# Patient Record
Sex: Female | Born: 1935 | Race: White | Hispanic: No | Marital: Married | Smoking: Never smoker
Health system: Southern US, Community
[De-identification: ages and names within clinical notes are randomized; demographics above are authoritative.]

## PROBLEM LIST (undated history)

## (undated) DIAGNOSIS — M1990 Unspecified osteoarthritis, unspecified site: Secondary | ICD-10-CM

## (undated) DIAGNOSIS — R2232 Localized swelling, mass and lump, left upper limb: Secondary | ICD-10-CM

## (undated) DIAGNOSIS — I4891 Unspecified atrial fibrillation: Secondary | ICD-10-CM

## (undated) DIAGNOSIS — I454 Nonspecific intraventricular block: Secondary | ICD-10-CM

## (undated) DIAGNOSIS — J449 Chronic obstructive pulmonary disease, unspecified: Secondary | ICD-10-CM

## (undated) HISTORY — PX: BREAST SURGERY: SHX581

## (undated) HISTORY — PX: OTHER SURGICAL HISTORY: SHX169

---

## 2009-01-04 ENCOUNTER — Ambulatory Visit: Payer: Self-pay | Admitting: Diagnostic Radiology

## 2009-01-04 ENCOUNTER — Emergency Department (HOSPITAL_BASED_OUTPATIENT_CLINIC_OR_DEPARTMENT_OTHER): Admission: EM | Admit: 2009-01-04 | Discharge: 2009-01-04 | Payer: Self-pay | Admitting: Emergency Medicine

## 2009-12-20 ENCOUNTER — Ambulatory Visit: Payer: Self-pay | Admitting: Diagnostic Radiology

## 2009-12-20 ENCOUNTER — Emergency Department (HOSPITAL_BASED_OUTPATIENT_CLINIC_OR_DEPARTMENT_OTHER): Admission: EM | Admit: 2009-12-20 | Discharge: 2009-12-20 | Payer: Self-pay | Admitting: Emergency Medicine

## 2011-02-28 LAB — CBC
HCT: 40.3 % (ref 36.0–46.0)
Hemoglobin: 13.9 g/dL (ref 12.0–15.0)
WBC: 15.7 10*3/uL — ABNORMAL HIGH (ref 4.0–10.5)

## 2011-02-28 LAB — BASIC METABOLIC PANEL
BUN: 14 mg/dL (ref 6–23)
Calcium: 9.3 mg/dL (ref 8.4–10.5)
Chloride: 103 mEq/L (ref 96–112)
GFR calc non Af Amer: >60 mL/min (ref >60)

## 2011-02-28 LAB — URINALYSIS, ROUTINE W REFLEX MICROSCOPIC
Bilirubin Urine: NEGATIVE
Glucose, UA: NEGATIVE mg/dL
Ketones, ur: NEGATIVE mg/dL
Protein, ur: NEGATIVE mg/dL
pH: 6.5 (ref 5.0–8.0)

## 2011-02-28 LAB — DIFFERENTIAL
Lymphocytes Relative: 19 % (ref 12–46)
Monocytes Absolute: 1.2 10*3/uL — ABNORMAL HIGH (ref 0.1–1.0)
Monocytes Relative: 8 % (ref 3–12)
Neutro Abs: 10.9 10*3/uL — ABNORMAL HIGH (ref 1.7–7.7)

## 2012-03-12 ENCOUNTER — Encounter (HOSPITAL_BASED_OUTPATIENT_CLINIC_OR_DEPARTMENT_OTHER): Payer: Self-pay | Admitting: *Deleted

## 2012-03-12 ENCOUNTER — Emergency Department (HOSPITAL_BASED_OUTPATIENT_CLINIC_OR_DEPARTMENT_OTHER)
Admission: EM | Admit: 2012-03-12 | Discharge: 2012-03-12 | Disposition: A | Discharge status: Home / Self Care | Payer: PRIVATE HEALTH INSURANCE | Attending: Emergency Medicine | Admitting: Emergency Medicine

## 2012-03-12 DIAGNOSIS — J4489 Other specified chronic obstructive pulmonary disease: Secondary | ICD-10-CM | POA: Insufficient documentation

## 2012-03-12 DIAGNOSIS — J449 Chronic obstructive pulmonary disease, unspecified: Secondary | ICD-10-CM

## 2012-03-12 DIAGNOSIS — R059 Cough, unspecified: Secondary | ICD-10-CM | POA: Insufficient documentation

## 2012-03-12 DIAGNOSIS — J3489 Other specified disorders of nose and nasal sinuses: Secondary | ICD-10-CM | POA: Insufficient documentation

## 2012-03-12 DIAGNOSIS — R05 Cough: Secondary | ICD-10-CM | POA: Insufficient documentation

## 2012-03-12 MED ORDER — IPRATROPIUM BROMIDE 0.02 % IN SOLN
0.5000 mg | Freq: Once | RESPIRATORY_TRACT | Status: AC
  Administered 2012-03-12: 0.5 mg via RESPIRATORY_TRACT
  Filled 2012-03-12: qty 2.5

## 2012-03-12 MED ORDER — ALBUTEROL SULFATE (5 MG/ML) 0.5% IN NEBU
5.0000 mg | INHALATION_SOLUTION | Freq: Once | RESPIRATORY_TRACT | Status: AC
  Administered 2012-03-12: 5 mg via RESPIRATORY_TRACT
  Filled 2012-03-12: qty 1

## 2012-03-12 MED ORDER — PREDNISONE 20 MG PO TABS: 60.0000 mg | ORAL_TABLET | Freq: Every day | ORAL | Status: AC

## 2012-03-12 MED ORDER — PREDNISONE 50 MG PO TABS
60.0000 mg | ORAL_TABLET | Freq: Once | ORAL | Status: AC
  Administered 2012-03-12: 60 mg via ORAL
  Filled 2012-03-12: qty 1

## 2012-03-12 NOTE — Discharge Instructions (Signed)

## 2012-03-12 NOTE — ED Provider Notes (Signed)
History   This chart was scribed for Celene Kras, MD by Melba Coon. The patient was seen in room MH04/MH04 and the patient's care was started at 5:40PM.    CSN: [id number]  Arrival date & time 03/12/12  1640   First MD Initiated Contact with Patient 03/12/12 1736      Chief Complaint  Patient presents with  . Cough    (Consider location/radiation/quality/duration/timing/severity/associated sxs/prior treatment) HPI Ashley Mccall is a 78 y.o. female who presents to the Emergency Department complaining of persistent moderate to severe cough with associated nasal drainage with an onset 3 days ago. Pt has a hx of bronchitis and states that this episode feels the same. Pt states that nasal drainage is bloody, yellow and sticky and that she wheezes more than usual. She has not used any inhalers recently. No HA, fever, neck pain, sore throat, rash, back pain, CP, SOB, abd pain, n/v/d, dysuria, or extremity pain, edema, weakness, numbness, or tingling. Hx of COPD and adult-onset asthma. No known allergies. No other pertinent medical symptoms. Pt is a former smoker (quit in 1985).  History reviewed. No pertinent past medical history.  Past Surgical History  Procedure Date  . Breast surgery     History reviewed. No pertinent family history.  History  Substance Use Topics  . Smoking status: Never Smoker   . Smokeless tobacco: Not on file  . Alcohol Use: No    OB History    Grav Para Term Preterm Abortions TAB SAB Ect Mult Living                  Review of Systems 10 Systems reviewed and all are negative for acute change except as noted in the HPI.   Allergies  Demerol; Macrobid; and Tetracyclines & related  Home Medications   Current Outpatient Rx  Name Route Sig Dispense Refill  . ACETAMINOPHEN 500 MG PO TABS Oral Take 500 mg by mouth 3 (three) times daily as needed. For pain    . ALBUTEROL SULFATE HFA 108 (90 BASE) MCG/ACT IN AERS Inhalation Inhale 2 puffs into the  lungs 2 (two) times daily. For COPD    . AMLODIPINE BESYLATE 10 MG PO TABS Oral Take 10 mg by mouth daily.    . ASPIRIN 81 MG PO TABS Oral Take 81 mg by mouth daily.    Marland Kitchen CALCIUM + D PO Oral Take 1 tablet by mouth daily.    . DONEPEZIL HCL 10 MG PO TABS Oral Take 10 mg by mouth at bedtime.    Marland Kitchen FEXOFENADINE HCL 180 MG PO TABS Oral Take 180 mg by mouth daily.    Marland Kitchen FLUTICASONE PROPIONATE  HFA 220 MCG/ACT IN AERO Inhalation Inhale 2 puffs into the lungs 2 (two) times daily. For COPD    . MONTELUKAST SODIUM 10 MG PO TABS Oral Take 10 mg by mouth at bedtime.    . OMEPRAZOLE MAGNESIUM 20 MG PO TBEC Oral Take 20 mg by mouth daily.    Marland Kitchen POTASSIUM PO Oral Take 1 tablet by mouth daily.      BP 133/62  Pulse 72  Temp(Src) 97.9 F (36.6 C) (Oral)  Resp 20  Ht 5\' 4"  (1.626 m)  Wt 163 lb (73.936 kg)  BMI 27.98 kg/m2  SpO2 96%  Physical Exam  Nursing note and vitals reviewed. Constitutional: She appears well-developed and well-nourished. No distress.  HENT:  Head: Normocephalic and atraumatic.  Right Ear: External ear normal.  Left Ear: External ear normal.  Eyes: Conjunctivae are normal. Right eye exhibits no discharge. Left eye exhibits no discharge. No scleral icterus.  Neck: Neck supple. No tracheal deviation present.  Cardiovascular: Normal rate, regular rhythm and intact distal pulses.   Pulmonary/Chest: Effort normal and breath sounds normal. No stridor. No respiratory distress. She has no wheezes. She has no rales.       Frequent coughing; slightly diminished sounds at the bases  Abdominal: Soft. Bowel sounds are normal. She exhibits no distension. There is no tenderness. There is no rebound and no guarding.  Musculoskeletal: She exhibits no edema and no tenderness.  Neurological: She is alert. She has normal strength. No sensory deficit. Cranial nerve deficit:  no gross defecits noted. She exhibits normal muscle tone. She displays no seizure activity. Coordination normal.  Skin: Skin  is warm and dry. No rash noted.  Psychiatric: She has a normal mood and affect.    ED Course  Procedures (including critical care time)  DIAGNOSTIC STUDIES: Oxygen Saturation is 96% on room air, adequate by my interpretation.    COORDINATION OF CARE:  5:45PM - Pt states that she does not a CXR or abx; only wants prednisone treatment and Rx; EDMD agrees; plans for d/c   Labs Reviewed - No data to display No results found.   1. COPD (chronic obstructive pulmonary disease)       MDM  I suspect the patient is having a COPD exacerbation/bronchitis. There is no wheezing on exam. There is no crackles to auscultation. Patient is comfortable with a course of steroids which has helped her in the past. She prefers to take over-the-counter cough medications as needed.  I personally performed the services described in this documentation, which was scribed in my presence.  The recorded information has been reviewed and considered.        Celene Kras, MD 03/12/12 [phone]

## 2012-03-12 NOTE — ED Notes (Signed)
Pt c/o cough x 3 days. Hx bronchitis. "Feels the same" Bloody, yellow drainage from nose.

## 2015-07-13 ENCOUNTER — Emergency Department (HOSPITAL_COMMUNITY)
Admission: EM | Admit: 2015-07-13 | Discharge: 2015-07-13 | Disposition: A | Discharge status: Home / Self Care | Payer: Medicare Other | Attending: Emergency Medicine | Admitting: Emergency Medicine

## 2015-07-13 ENCOUNTER — Encounter (HOSPITAL_COMMUNITY): Payer: Self-pay | Admitting: Emergency Medicine

## 2015-07-13 ENCOUNTER — Emergency Department (HOSPITAL_COMMUNITY): Payer: Medicare Other

## 2015-07-13 DIAGNOSIS — Z7982 Long term (current) use of aspirin: Secondary | ICD-10-CM | POA: Diagnosis not present

## 2015-07-13 DIAGNOSIS — J449 Chronic obstructive pulmonary disease, unspecified: Secondary | ICD-10-CM | POA: Insufficient documentation

## 2015-07-13 DIAGNOSIS — R1010 Upper abdominal pain, unspecified: Secondary | ICD-10-CM

## 2015-07-13 DIAGNOSIS — M1990 Unspecified osteoarthritis, unspecified site: Secondary | ICD-10-CM | POA: Insufficient documentation

## 2015-07-13 DIAGNOSIS — R079 Chest pain, unspecified: Secondary | ICD-10-CM | POA: Insufficient documentation

## 2015-07-13 DIAGNOSIS — I4891 Unspecified atrial fibrillation: Secondary | ICD-10-CM | POA: Insufficient documentation

## 2015-07-13 DIAGNOSIS — Z79899 Other long term (current) drug therapy: Secondary | ICD-10-CM | POA: Insufficient documentation

## 2015-07-13 DIAGNOSIS — R1013 Epigastric pain: Secondary | ICD-10-CM | POA: Diagnosis not present

## 2015-07-13 DIAGNOSIS — R109 Unspecified abdominal pain: Secondary | ICD-10-CM | POA: Diagnosis present

## 2015-07-13 DIAGNOSIS — R112 Nausea with vomiting, unspecified: Secondary | ICD-10-CM | POA: Diagnosis not present

## 2015-07-13 DIAGNOSIS — R61 Generalized hyperhidrosis: Secondary | ICD-10-CM | POA: Diagnosis not present

## 2015-07-13 HISTORY — DX: Localized swelling, mass and lump, left upper limb: R22.32

## 2015-07-13 HISTORY — DX: Unspecified osteoarthritis, unspecified site: M19.90

## 2015-07-13 HISTORY — DX: Chronic obstructive pulmonary disease, unspecified: J44.9

## 2015-07-13 HISTORY — DX: Unspecified atrial fibrillation: I48.91

## 2015-07-13 HISTORY — DX: Nonspecific intraventricular block: I45.4

## 2015-07-13 LAB — CBC WITH DIFFERENTIAL/PLATELET
BASOS ABS: 0 10*3/uL (ref 0.0–0.1)
BASOS PCT: 0 % (ref 0–1)
EOS PCT: 1 % (ref 0–5)
Eosinophils Absolute: 0.1 10*3/uL (ref 0.0–0.7)
HEMATOCRIT: 37.4 % (ref 36.0–46.0)
Hemoglobin: 12.6 g/dL (ref 12.0–15.0)
Lymphocytes Relative: 24 % (ref 12–46)
Lymphs Abs: 2.4 10*3/uL (ref 0.7–4.0)
MCH: 29.6 pg (ref 26.0–34.0)
MCHC: 33.7 g/dL (ref 30.0–36.0)
MCV: 88 fL (ref 78.0–100.0)
MONO ABS: 0.7 10*3/uL (ref 0.1–1.0)
Monocytes Relative: 7 % (ref 3–12)
Neutro Abs: 6.8 10*3/uL (ref 1.7–7.7)
Neutrophils Relative %: 68 % (ref 43–77)
Platelets: 217 10*3/uL (ref 150–400)
RBC: 4.25 MIL/uL (ref 3.87–5.11)
RDW: 13.8 % (ref 11.5–15.5)
WBC: 10 10*3/uL (ref 4.0–10.5)

## 2015-07-13 LAB — COMPREHENSIVE METABOLIC PANEL
ALT: 35 U/L (ref 14–54)
AST: 55 U/L — ABNORMAL HIGH (ref 15–41)
Albumin: 3.6 g/dL (ref 3.5–5.0)
Alkaline Phosphatase: 64 U/L (ref 38–126)
Anion gap: 6 (ref 5–15)
BUN: 18 mg/dL (ref 6–20)
CALCIUM: 9.2 mg/dL (ref 8.9–10.3)
CO2: 28 mmol/L (ref 22–32)
CREATININE: 0.75 mg/dL (ref 0.44–1.00)
Chloride: 108 mmol/L (ref 101–111)
GLUCOSE: 92 mg/dL (ref 65–99)
Potassium: 3.8 mmol/L (ref 3.5–5.1)
Sodium: 142 mmol/L (ref 135–145)
Total Bilirubin: 0.3 mg/dL (ref 0.3–1.2)
Total Protein: 6 g/dL — ABNORMAL LOW (ref 6.5–8.1)

## 2015-07-13 LAB — URINALYSIS, ROUTINE W REFLEX MICROSCOPIC
Bilirubin Urine: NEGATIVE
Glucose, UA: NEGATIVE mg/dL
Hgb urine dipstick: NEGATIVE
Ketones, ur: NEGATIVE mg/dL
LEUKOCYTES UA: NEGATIVE
NITRITE: NEGATIVE
PROTEIN: NEGATIVE mg/dL
SPECIFIC GRAVITY, URINE: 1.004 — AB (ref 1.005–1.030)
UROBILINOGEN UA: 0.2 mg/dL (ref 0.0–1.0)
pH: 7.5 (ref 5.0–8.0)

## 2015-07-13 LAB — I-STAT TROPONIN, ED: Troponin i, poc: 0.01 ng/mL (ref 0.00–0.08)

## 2015-07-13 LAB — LIPASE, BLOOD: Lipase: 36 U/L (ref 22–51)

## 2015-07-13 MED ORDER — ONDANSETRON HCL 4 MG/2ML IJ SOLN
4.0000 mg | Freq: Once | INTRAMUSCULAR | Status: AC
  Administered 2015-07-13: 4 mg via INTRAVENOUS
  Filled 2015-07-13: qty 2

## 2015-07-13 MED ORDER — SODIUM CHLORIDE 0.9 % IV BOLUS (SEPSIS)
500.0000 mL | Freq: Once | INTRAVENOUS | Status: AC
  Administered 2015-07-13: 500 mL via INTRAVENOUS

## 2015-07-13 NOTE — ED Notes (Signed)
Bed: YQ03 Expected date: 07/13/15 Expected time: 7:26 PM Means of arrival: Ambulance Comments: Nausea, vomiting

## 2015-07-13 NOTE — ED Notes (Signed)
Patient unable to urinate at this time. 

## 2015-07-13 NOTE — Discharge Instructions (Signed)
1. Medications: usual home medications 2. Treatment: rest, drink plenty of fluids,  3. Follow Up: Please followup with your primary doctor and cardiologist in 3-5 days for discussion of your diagnoses and further evaluation after today's visit; if you do not have a primary care doctor use the resource guide provided to find one; Please return to the ER for return of symptoms or other concerns

## 2015-07-13 NOTE — ED Notes (Signed)
Brought in by EMS from home with c/o abdominal pain.  Pt reports that she has had "big lunch" today from a buffet and felt fine until it's time for dinner when she started having LUQ abdominal pain with nausea and vomiting, no diarrhea.

## 2015-07-13 NOTE — ED Provider Notes (Signed)
CSN: [id number]     Arrival date & time 07/13/15  1948 History   First MD Initiated Contact with Patient 07/13/15 2009     Chief Complaint  Patient presents with  . Abdominal Pain     (Consider location/radiation/quality/duration/timing/severity/associated sxs/prior Treatment) Patient is a 81 y.o. female presenting with abdominal pain. The history is provided by the patient and medical records. No language interpreter was used.  Abdominal Pain Associated symptoms: chest pain, nausea and vomiting   Associated symptoms: no constipation, no cough, no diarrhea, no dysuria, no fatigue, no fever, no hematuria and no shortness of breath      Ashley Mccall is a 80 y.o. female  with a hx of arthritis, a-fib, COPD, BBB (reveal linq cardiac monitor implant) presents to the Emergency Department complaining of acute episode of near syncope with associated diaphoresis, nausea, vomiting x1 onset PTA. Episode was associated with sharp, left lower chest pain below her left breast that has resolved at this time.  No known aggravating of alleviating factors.  Pt reports she ate a larger than normal lunch.  She denies feeling ill prior to the episode and denies additional prodrome.  The episode was brief and resolved spontaneously. She denies hx of MI or pain like this in the past.  Pt denies fever, chills, headache, neck pain, SOB, cough, diarrhea, full syncope, dysuria.       Past Medical History  Diagnosis Date  . Arthritis   . Atrial fibrillation   . BBB (bundle branch block)   . COPD (chronic obstructive pulmonary disease)   . Nodule of left palm    Past Surgical History  Procedure Laterality Date  . Breast surgery    . Reveal linq cardiac monitor implant     History reviewed. No pertinent family history. History  Substance Use Topics  . Smoking status: Never Smoker   . Smokeless tobacco: Not on file  . Alcohol Use: No   OB History    No data available     Review of Systems   Constitutional: Positive for diaphoresis. Negative for fever, appetite change, fatigue and unexpected weight change.  HENT: Negative for mouth sores.   Eyes: Negative for visual disturbance.  Respiratory: Negative for cough, chest tightness, shortness of breath and wheezing.   Cardiovascular: Positive for chest pain.  Gastrointestinal: Positive for nausea, vomiting and abdominal pain. Negative for diarrhea and constipation.  Endocrine: Negative for polydipsia, polyphagia and polyuria.  Genitourinary: Negative for dysuria, urgency, frequency and hematuria.  Musculoskeletal: Negative for back pain and neck stiffness.  Skin: Negative for rash.  Allergic/Immunologic: Negative for immunocompromised state.  Neurological: Positive for syncope (near syncope). Negative for light-headedness and headaches.  Hematological: Does not bruise/bleed easily.  Psychiatric/Behavioral: Negative for sleep disturbance. The patient is not nervous/anxious.       Allergies  Demerol; Macrobid; and Tetracyclines & related  Home Medications   Prior to Admission medications   Medication Sig Start Date End Date Taking? Authorizing Provider  acetaminophen (TYLENOL) 500 MG tablet Take 500 mg by mouth 3 (three) times daily as needed for mild pain or moderate pain.    Yes Historical Provider, MD  albuterol (PROVENTIL HFA;VENTOLIN HFA) 108 (90 BASE) MCG/ACT inhaler Inhale 2 puffs into the lungs every 6 (six) hours as needed for wheezing or shortness of breath. For COPD   Yes Historical Provider, MD  amLODipine (NORVASC) 5 MG tablet Take 5 mg by mouth daily. 06/17/15  Yes Historical Provider, MD  aspirin 81 MG  tablet Take 162 mg by mouth 2 (two) times daily.    Yes Historical Provider, MD  Calcium Carbonate-Vitamin D (CALCIUM + D PO) Take 1 tablet by mouth daily.   Yes Historical Provider, MD  donepezil (ARICEPT) 10 MG tablet Take 10 mg by mouth at bedtime.   Yes Historical Provider, MD  fexofenadine (ALLEGRA) 180 MG  tablet Take 180 mg by mouth daily.   Yes Historical Provider, MD  fluticasone (FLOVENT HFA) 220 MCG/ACT inhaler Inhale 2 puffs into the lungs daily as needed (SOB, wheezing). For COPD   Yes Historical Provider, MD  mometasone-formoterol (DULERA) 100-5 MCG/ACT AERO Inhale 1 puff into the lungs 2 (two) times daily.   Yes Historical Provider, MD  montelukast (SINGULAIR) 10 MG tablet Take 10 mg by mouth at bedtime.   Yes Historical Provider, MD  potassium chloride (K-DUR,KLOR-CON) 10 MEQ tablet Take 10 mEq by mouth every other day.  06/23/15  Yes Historical Provider, MD  zolpidem (AMBIEN) 5 MG tablet Take 5 mg by mouth at bedtime as needed for sleep.  05/13/15  Yes Historical Provider, MD   BP 140/53 mmHg  Pulse 71  Temp(Src) 98.4 F (36.9 C) (Oral)  Resp 16  SpO2 98% Physical Exam  Constitutional: She appears well-developed and well-nourished. No distress.  Awake, alert, nontoxic appearance  HENT:  Head: Normocephalic and atraumatic.  Mouth/Throat: Oropharynx is clear and moist. No oropharyngeal exudate.  Eyes: Conjunctivae are normal. No scleral icterus.  Neck: Normal range of motion. Neck supple.  Cardiovascular: Normal rate, regular rhythm, normal heart sounds and intact distal pulses.   No murmur heard. Pulmonary/Chest: Effort normal and breath sounds normal. No respiratory distress. She has no wheezes.  Equal chest expansion Clear and equal breath sounds  Abdominal: Soft. Bowel sounds are normal. She exhibits no distension and no mass. There is tenderness (mild) in the epigastric area. There is no rebound and no guarding.  Soft and nontender  Musculoskeletal: Normal range of motion. She exhibits no edema.  Neurological: She is alert.  Speech is clear and goal oriented Moves extremities without ataxia  Skin: Skin is warm and dry. She is not diaphoretic.  Psychiatric: She has a normal mood and affect.  Nursing note and vitals reviewed.   ED Course  Procedures (including critical  care time) Labs Review Labs Reviewed  COMPREHENSIVE METABOLIC PANEL - Abnormal; Notable for the following:    Total Protein 6.0 (*)    AST 55 (*)    All other components within normal limits  URINALYSIS, ROUTINE W REFLEX MICROSCOPIC (NOT AT [facility]) - Abnormal; Notable for the following:    APPearance CLOUDY (*)    Specific Gravity, Urine 1.004 (*)    All other components within normal limits  CBC WITH DIFFERENTIAL/PLATELET  LIPASE, BLOOD  I-STAT TROPOININ, ED    Imaging Review Dg Chest 2 View  07/13/2015   CLINICAL DATA:  Upper abdominal pain with nausea and vomiting for 1 day.  EXAM: CHEST  2 VIEW  COMPARISON:  11/27/2014  FINDINGS: The lungs are hyperinflated with chronic bronchitic change. The cardiomediastinal contours are normal. No pulmonary edema. No consolidation, pleural effusion, or pneumothorax. Implanted monitoring device in the left chest wall. No acute osseous abnormalities are seen.  IMPRESSION: Emphysema without acute pulmonary process.   Electronically Signed   By: Rubye Oaks M.D.   On: 07/13/2015 21:45     EKG Interpretation   Date/Time:  Sunday July 13 2015 20:37:49 EDT Ventricular Rate:  75 PR Interval:  143 QRS Duration: 146 QT Interval:  453 QTC Calculation: 506 R Axis:   -2 Text Interpretation:  Sinus rhythm Left bundle branch block Since last  tracing Left bundle branch block is new Confirmed by Effie Shy  MD, Mechele Collin  ([id number]) on 07/13/2015 10:34:31 PM         MDM   Final diagnoses:  N&V (nausea and vomiting)  Upper abdominal pain  Left sided chest pain   Ashley Mccall presents after sudden onset near syncope with associated Left chest pain, nausea and vomiting.  No reported or observed neurologic deficits during this episode.  Pt with resolved symptoms on arrival.  Will begin work-up.    Patient with negative troponin, normal urinalysis and otherwise normal labs. Patient has a history of similar episodes, sharp in nature and short-lived.     Record review with previous similar episodes and is followed by [facility].  She currently has a loop recorder in place and denied being contacted about an arrhythmia tonight after the near syncope.    11:00PM Pt remains symptom free in the ED with RRR.  She is neurologically intact.  Doubt ACS based on symptoms, ECG with sinus rhythm and negative troponin.  HEART score 3 - low risk.  No exertional component.  CXR without PNA, CHF or pneumothorax.  Pt will be d/c home with close cardiology follow-up.   BP 140/53 mmHg  Pulse 71  Temp(Src) 98.4 F (36.9 C) (Oral)  Resp 16  SpO2 98%  The patient was discussed with and seen by Dr. Adela Lank who agrees with the treatment plan.     Ashley Client Rylyn Zawistowski, PA-C 07/13/15 2357  Melene Plan, DO 07/14/15 1006

## 2015-07-13 NOTE — ED Provider Notes (Signed)
Medical screening examination/treatment/procedure(s) were conducted as a shared visit with non-physician practitioner(s) and myself.  I personally evaluated the patient during the encounter.   EKG Interpretation   Date/Time:   yo F with a chief complaint of a presyncopal event. Upon record review patient has had multiple disease over the past year. Patient sees a cardiologist at [facility] has a loop recorder in which she is found to have episodes of left bundle branch block with this. They're currently in discussion on whether or not to place a ICD. Patient had some transient chest pain just prior to this. It lasted about 20 minutes. Was sudden severe sensitivity with vomiting. Resolved spontaneously. Pain is reproduced with palpation of the epigastrium. Heart rate and rhythm regular lungs clear to auscultation bilaterally patient able to ambulate without difficulty coordination is intact.  He'll unlikely caused by coronary artery disease. Patient has had a prolonged workup for similar complaints as well as having echoes and loop recorder. Feel no need for admission for further workup.    Melene Plan, DO 07/13/15 2313

## 2016-08-12 ENCOUNTER — Other Ambulatory Visit (HOSPITAL_BASED_OUTPATIENT_CLINIC_OR_DEPARTMENT_OTHER): Payer: Self-pay | Admitting: Physical Medicine and Rehabilitation

## 2016-08-12 DIAGNOSIS — M542 Cervicalgia: Secondary | ICD-10-CM

## 2016-08-13 ENCOUNTER — Ambulatory Visit (HOSPITAL_BASED_OUTPATIENT_CLINIC_OR_DEPARTMENT_OTHER): Payer: Medicare Other

## 2016-08-13 ENCOUNTER — Ambulatory Visit (HOSPITAL_BASED_OUTPATIENT_CLINIC_OR_DEPARTMENT_OTHER)
Admission: RE | Admit: 2016-08-13 | Discharge: 2016-08-13 | Disposition: A | Discharge status: Home / Self Care | Payer: Medicare Other | Source: Ambulatory Visit | Attending: Physical Medicine and Rehabilitation | Admitting: Physical Medicine and Rehabilitation

## 2016-08-13 DIAGNOSIS — M47892 Other spondylosis, cervical region: Secondary | ICD-10-CM | POA: Diagnosis not present

## 2016-08-13 DIAGNOSIS — M542 Cervicalgia: Secondary | ICD-10-CM | POA: Diagnosis present

## 2016-08-13 DIAGNOSIS — M50321 Other cervical disc degeneration at C4-C5 level: Secondary | ICD-10-CM | POA: Insufficient documentation

## 2017-01-07 ENCOUNTER — Other Ambulatory Visit (INDEPENDENT_AMBULATORY_CARE_PROVIDER_SITE_OTHER): Payer: Self-pay

## 2017-01-07 DIAGNOSIS — M25551 Pain in right hip: Secondary | ICD-10-CM

## 2017-01-10 ENCOUNTER — Ambulatory Visit (HOSPITAL_BASED_OUTPATIENT_CLINIC_OR_DEPARTMENT_OTHER)
Admission: RE | Admit: 2017-01-10 | Discharge: 2017-01-10 | Disposition: A | Discharge status: Home / Self Care | Payer: Medicare Other | Source: Ambulatory Visit | Attending: Orthopaedic Surgery | Admitting: Orthopaedic Surgery

## 2017-01-10 ENCOUNTER — Ambulatory Visit (INDEPENDENT_AMBULATORY_CARE_PROVIDER_SITE_OTHER): Payer: Medicare Other | Admitting: Orthopaedic Surgery

## 2017-01-10 DIAGNOSIS — G8929 Other chronic pain: Secondary | ICD-10-CM | POA: Insufficient documentation

## 2017-01-10 DIAGNOSIS — M25551 Pain in right hip: Secondary | ICD-10-CM | POA: Diagnosis not present

## 2017-01-10 DIAGNOSIS — I70208 Unspecified atherosclerosis of native arteries of extremities, other extremity: Secondary | ICD-10-CM | POA: Insufficient documentation

## 2017-01-10 DIAGNOSIS — M8580 Other specified disorders of bone density and structure, unspecified site: Secondary | ICD-10-CM | POA: Diagnosis not present

## 2017-01-10 NOTE — Progress Notes (Signed)
   Office Visit Note   Patient: Ashley Mccall           Date of Birth: 1934/10/30           MRN: [id number] Visit Date: 01/10/2017              Requested by: No referring provider defined for this encounter. PCP: Pcp Not In System   Assessment & Plan: Visit Diagnoses:  1. Pain of right hip joint     Plan: Right now we will try combination of a six-day steroid taper as well as physical therapy. I like to see her back in a month and consider trochanteric injections if needed at that point.  Follow-Up Instructions: Return in about 4 weeks (around 02/07/2017).   Orders:  No orders of the defined types were placed in this encounter.  No orders of the defined types were placed in this encounter.     Procedures: No procedures performed   Clinical Data: No additional findings.   Subjective: No chief complaint on file. She comes in with a chief complaint of bilateral hip pain right worse than left. She points the trochanteric areas source of her pain. It has been going on for about a year. It hurts mainly when she sits but also when she walks as become frustrating. She denies any low back pain. She denies any pain in her groin.  HPI  Review of Systems He is literally 3. She feels though as if she is starting to get a cold or even the flu.  Objective: Vital Signs: There were no vitals taken for this visit.  Physical Exam She is alert and oriented 3 and in no acute distress Ortho Exam examination of both hips show fluid range of motion of both hips with no blocks to motion although is no pain in the groin at all. Her pain is only to palpation of the trochanteric area on both hips. Specialty Comments:  No specialty comments available.  Imaging: Dg Hip Unilat With Pelvis 1v Right  Result Date: 01/10/2017 CLINICAL DATA:  Chronic pain and tenderness. EXAM: DG HIP (WITH OR WITHOUT PELVIS) 1V RIGHT COMPARISON:  CT 12/14/2015 . FINDINGS: Diffuse osteopenia and degenerative  change. No acute bony abnormality identified. No evidence of fracture dislocation. Aortoiliac atherosclerotic vascular calcification. Pelvic calcifications consistent phleboliths. IMPRESSION: Diffuse osteopenia degenerative change . No acute abnormality identified. Electronically Signed   By: Maisie Fushomas  Register   On: 01/10/2017 09:02     PMFS History: Patient Active Problem List   Diagnosis Date Noted  . Pain of right hip joint 01/10/2017   Past Medical History:  Diagnosis Date  . Arthritis   . Atrial fibrillation   . BBB (bundle branch block)   . COPD (chronic obstructive pulmonary disease)   . Nodule of left palm     No family history on file.  Past Surgical History:  Procedure Laterality Date  . BREAST SURGERY    . reveal LINQ cardiac monitor implant     Social History   Occupational History  . Not on file.   Social History Main Topics  . Smoking status: Never Smoker  . Smokeless tobacco: Not on file  . Alcohol use No  . Drug use: No  . Sexual activity: Not on file

## 2017-01-11 ENCOUNTER — Other Ambulatory Visit (HOSPITAL_BASED_OUTPATIENT_CLINIC_OR_DEPARTMENT_OTHER): Payer: Self-pay | Admitting: Internal Medicine

## 2017-01-11 ENCOUNTER — Ambulatory Visit (HOSPITAL_BASED_OUTPATIENT_CLINIC_OR_DEPARTMENT_OTHER)
Admission: RE | Admit: 2017-01-11 | Discharge: 2017-01-11 | Disposition: A | Discharge status: Home / Self Care | Payer: Medicare Other | Source: Ambulatory Visit | Attending: Internal Medicine | Admitting: Internal Medicine

## 2017-01-11 DIAGNOSIS — J181 Lobar pneumonia, unspecified organism: Principal | ICD-10-CM

## 2017-01-11 DIAGNOSIS — J189 Pneumonia, unspecified organism: Secondary | ICD-10-CM

## 2017-01-11 DIAGNOSIS — J449 Chronic obstructive pulmonary disease, unspecified: Secondary | ICD-10-CM | POA: Diagnosis not present

## 2017-01-17 ENCOUNTER — Telehealth (INDEPENDENT_AMBULATORY_CARE_PROVIDER_SITE_OTHER): Payer: Self-pay | Admitting: Physical Medicine and Rehabilitation

## 2017-01-17 NOTE — Telephone Encounter (Signed)
Yes we've addressed and on many occasions. She [facility] Magn[facility] He gave her some prednisone but had thought about a given her an injection for bursitis and he felt like this was bursitis. See if she can follow-up with him I think that would be the best option in terms of treating the bursitis. He did talk to me about her.

## 2017-01-17 NOTE — Telephone Encounter (Signed)
I spoke with patient and she will call me or [facility] if she decides she wants to see Dr. Magnus IvanBlackman. She said for now she will just keep dealing with it.

## 2017-07-14 ENCOUNTER — Ambulatory Visit (INDEPENDENT_AMBULATORY_CARE_PROVIDER_SITE_OTHER): Payer: Medicare Other | Admitting: Physical Medicine and Rehabilitation

## 2017-07-14 ENCOUNTER — Encounter (INDEPENDENT_AMBULATORY_CARE_PROVIDER_SITE_OTHER): Payer: Self-pay | Admitting: Physical Medicine and Rehabilitation

## 2017-07-14 VITALS — BP 126/64 | HR 81

## 2017-07-14 DIAGNOSIS — R202 Paresthesia of skin: Secondary | ICD-10-CM

## 2017-07-14 DIAGNOSIS — G8929 Other chronic pain: Secondary | ICD-10-CM

## 2017-07-14 DIAGNOSIS — M25511 Pain in right shoulder: Secondary | ICD-10-CM

## 2017-07-14 DIAGNOSIS — M542 Cervicalgia: Secondary | ICD-10-CM | POA: Diagnosis not present

## 2017-07-14 NOTE — Progress Notes (Deleted)
Right side neck pain into shoulder. Constant ache. Able to use arm and raise arm without difficulty.   Also complaining of left arm and hand numbness and weakness. Says left arm just feels different. Wondering if it has to do with her pacemaker. Says she has lost weight since getting the pacemaker and feels that it is too large.

## 2017-07-15 ENCOUNTER — Encounter (INDEPENDENT_AMBULATORY_CARE_PROVIDER_SITE_OTHER): Payer: Self-pay | Admitting: Physical Medicine and Rehabilitation

## 2017-07-15 MED ORDER — PREDNISONE 50 MG PO TABS: ORAL_TABLET | ORAL | 0 refills | Status: DC

## 2017-07-15 NOTE — Progress Notes (Signed)
Ashley Mccall - 83 y.o. female MRN [id number]  Date of birth: 1934-07-05  Office Visit Note: Visit Date: 07/14/2017 PCP: System, Pcp Not In Referred by: No ref. provider found  Subjective: Chief Complaint  Patient presents with  . Neck - Pain   HPI: Ashley Mccall is an 83 year old female and my aunt by marriage who comes in today with multiple somatic pain complaints. I last saw her in August 2017 with actually fairly similar complaints. She was having a lot of neck and shoulder pain and pain referring into the left arm and hand and somewhat of a C6 distribution into the thumb and first digit. She does get some dysesthesia and paresthesia. She has pain really in the neck and shoulders mid back lower back and bilateral thighs. She also gets symptoms consistent with more of a myalgia where she gets a sharp shooting pain that causes her to twitch and jump. She has multiple areas of tenderness along the iliotibial band bilaterally as well as the medial epicondyles on the left. She has a known history of myofascial pain which is separate from the fibromyalgia which she has also has. The last time I saw her we did order a CT scan of the cervical spine and this is reviewed below. This did show multilevel facet arthropathy without any high-grade bony stenosis centrally but there was 5 foraminal stenosis at different places. She feels like everything is flared up at this point and she just has a lot of pain complaints all over. She's not had any recent medical issues. She does have a history of cardiac problems. She has loss some weight over the last few years. She hasn't been to physical therapy recently but does limit [name] where she does have access to good physical therapy.    Review of Systems  Constitutional: Positive for malaise/fatigue and weight loss. Negative for chills and fever.  HENT: Negative for hearing loss and sinus pain.   Eyes: Negative for blurred vision, double vision and  photophobia.  Respiratory: Negative for cough and shortness of breath.   Cardiovascular: Negative for chest pain, palpitations and leg swelling.  Gastrointestinal: Negative for abdominal pain, nausea and vomiting.  Genitourinary: Negative for flank pain.  Musculoskeletal: Positive for back pain, joint pain, myalgias and neck pain.  Skin: Negative for itching and rash.  Neurological: Positive for tingling. Negative for tremors, focal weakness and weakness.  Endo/Heme/Allergies: Negative.   Psychiatric/Behavioral: Negative for depression.  All other systems reviewed and are negative.  Otherwise per HPI.  Assessment & Plan: Visit Diagnoses:  1. Cervicalgia   2. Chronic right shoulder pain   3. Paresthesia of skin     Plan: Findings:  My impression is that she is having a flareup of fibromyalgia along with real skeletal complaints of mostly arthritic problems and tendinitis/bursitis. The only concerning feature is more of the radicular-type pain in the left arm in the C6 distribution. On exam there is no weakness reflexes are normal. She does have trigger points in the bilateral trapezius levator scapula and rhomboid and infraspinatus. These are not tender points but true trigger points and myofascial pain and I think this was causing a lot of the increase in her symptoms. It is a cycle though when one area flares up she seems to get pain everywhere else. I think the best approach is a prednisone trial for 5 days of 50 mg without a taper. I discussed this with her at length that she's tolerated this in the past. I  also gave her a prescription for physical therapy that she can [facility] at [facility]. This would include myofascial release. Depending on how well she does we could look at trigger point injection here. We could look at potential cervical epidural but I'm not sure she will tolerate better want to do that. We talked about sleep hygiene with her. She does use a small bit of Ambien at night. Has  probably not the best choice for her but she says it's working at this point. We also talked about activity and exercise and she reports that she really has not been as active as he liked to be lately. I have encouraged her to increase her activity level. We'll see her back in a month and see how she's doing.    Meds & Orders:  Meds ordered this encounter  Medications  . predniSONE (DELTASONE) 50 MG tablet    Sig: Take 1 tablet daily with food for 5 days until finished    Dispense:  5 tablet    Refill:  0   No orders of the defined types were placed in this encounter.   Follow-up: Return in about 4 weeks (around 08/11/2017).   Procedures: No procedures performed  No notes on file   Clinical History: CT CERVICAL SPINE WITHOUT CONTRAST  TECHNIQUE: Multidetector CT imaging of the cervical spine was performed without intravenous contrast. Multiplanar CT image reconstructions were also generated.  COMPARISON:  No priors.  FINDINGS: There are no acute displaced fractures of the cervical spine. Reversal of normal cervical lordosis is noted, centered at the level of C4, presumably either degenerative or positional in nature. Alignment is otherwise anatomic. Prevertebral soft tissues are normal. Severe multilevel degenerative disc disease is identified, most severe at C4-C5, C5-C6 and C6-C7. Multilevel facet arthropathy (left greater than right). Mild scarring in the visualize lung apices. Small nodule noted in the left lobe of the thyroid gland measuring 1.4 x 0.9 cm, nonspecific, but similar to slightly smaller than prior chest CT 12/14/2015, favored to be benign.  IMPRESSION: 1. No acute displaced cervical spine fracture or other acute findings. 2. Severe multilevel degenerative disc disease and cervical spondylosis, as above.   Electronically Signed   By: Trudie Reedaniel  Entrikin M.D.   On: 08/13/2016 13:04  She reports that she has never smoked. She has never used smokeless  tobacco. No results for input(s): HGBA1C, LABURIC in the last 8760 hours.  Objective:  VS:  HT:    WT:   BMI:     BP:126/64  HR:81bpm  TEMP: ( )  RESP:  Physical Exam  Constitutional: She is oriented to person, place, and time. She appears well-developed and well-nourished.  Eyes: Pupils are equal, round, and reactive to light. Conjunctivae and EOM are normal.  Cardiovascular: Normal rate and intact distal pulses.   Pulmonary/Chest: Effort normal.  Musculoskeletal:  Patient sits with cervical spine forward flexion she does have significant active trigger points in the bilateral trapezius and left levator scapula bilateral rhomboids and teres minor. These do reproduce a lot of her upper extremity problems. She does have pain over the left medial condyle at the elbow. She has pain complaints and tender points however in multiple places. She has no pain with hip rotation and she has good distal strength. She ambulates without aid.  Neurological: She is alert and oriented to person, place, and time. She exhibits normal muscle tone.  Skin: Skin is warm and dry. No rash noted. No erythema.  Psychiatric: She has a  normal mood and affect. Her behavior is normal.  Nursing note and vitals reviewed.   Ortho Exam Imaging: No results found.  Past Medical/Family/Surgical/Social History: Medications & Allergies reviewed per EMR Patient Active Problem List   Diagnosis Date Noted  . Pain of right hip joint 01/10/2017   Past Medical History:  Diagnosis Date  . Arthritis   . Atrial fibrillation (HCC)   . BBB (bundle branch block)   . COPD (chronic obstructive pulmonary disease) (HCC)   . Nodule of left palm    History reviewed. No pertinent family history. Past Surgical History:  Procedure Laterality Date  . BREAST SURGERY    . reveal LINQ cardiac monitor implant     Social History   Occupational History  . Not on file.   Social History Main Topics  . Smoking status: Never Smoker  .  Smokeless tobacco: Never Used  . Alcohol use No  . Drug use: No  . Sexual activity: Not on file

## 2017-07-18 ENCOUNTER — Telehealth (INDEPENDENT_AMBULATORY_CARE_PROVIDER_SITE_OTHER): Payer: Self-pay | Admitting: Radiology

## 2017-07-18 NOTE — Telephone Encounter (Signed)
I called and advised patient she will call her PCP

## 2017-07-18 NOTE — Telephone Encounter (Signed)
Would like a call back from Dr. Alvester MorinNewton to discuss [place] [facility] [place]herra that she has been having of and on for 3 months.

## 2017-07-18 NOTE — Telephone Encounter (Signed)
Tell Cristy FriedlanderFlorence she would be better served obtain referral from PCP to GI doctor so they can be in the treatment loop and the PCP likely knows good GI doctors.

## 2017-08-03 ENCOUNTER — Telehealth (INDEPENDENT_AMBULATORY_CARE_PROVIDER_SITE_OTHER): Payer: Self-pay | Admitting: Physical Medicine and Rehabilitation

## 2017-08-03 NOTE — Telephone Encounter (Signed)
If dr. Magnus Ivan could see her before 9/4 then that would be best but obviously we can look at it then.

## 2017-08-03 NOTE — Telephone Encounter (Signed)
Called patient and left a message advising.

## 2017-08-15 IMAGING — CR DG CHEST 2V
2 series · 2 of 2 positions shown · non-contrast
Comparison: PA and lateral chest x-ray December 11, 2015

CLINICAL DATA: Cough and chest congestion for the past 7 days.
Suspect right lower lobe pneumonia. History of COPD, atrial
fibrillation

EXAM:
CHEST  2 VIEW

[w chest pa]
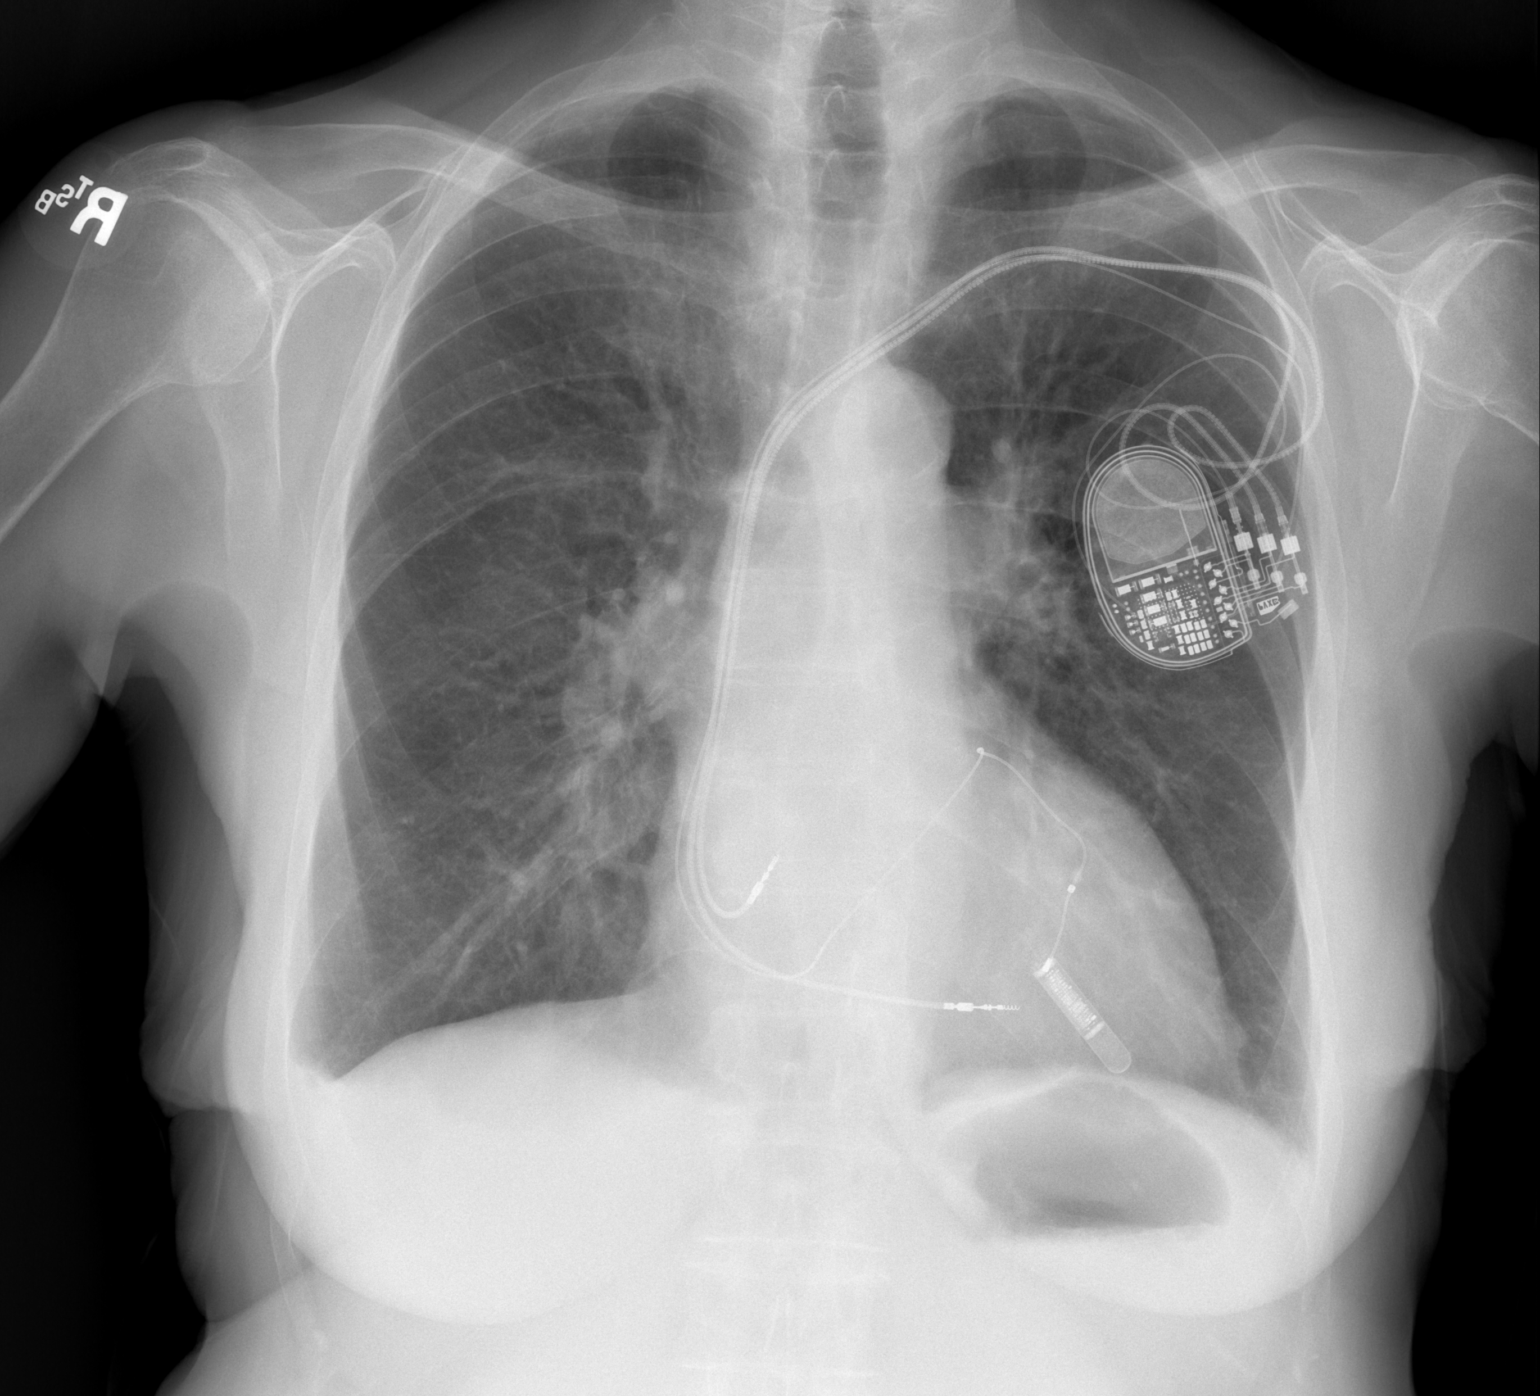

[w chest lat]
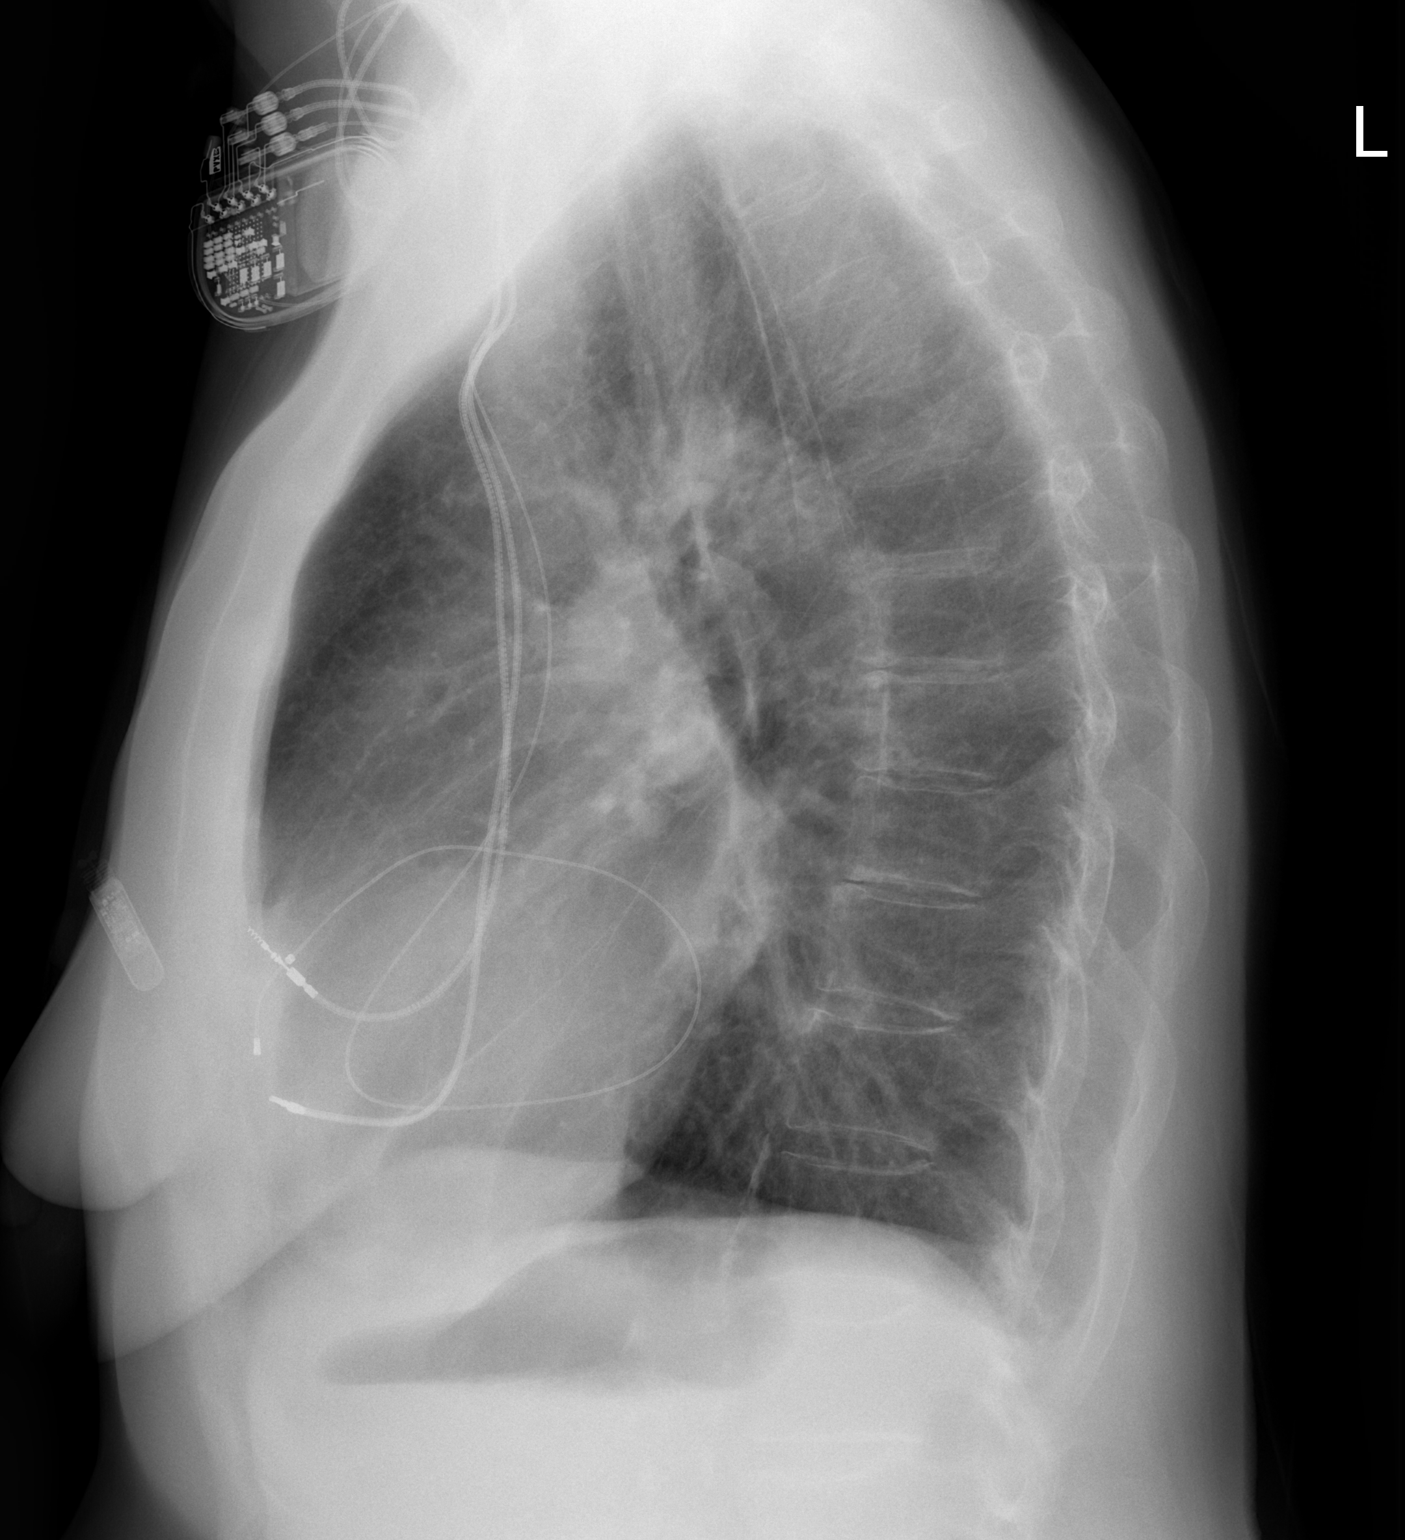

[2 of 2 positions shown; findings below may reference images not displayed]

FINDINGS: The lungs are mildly hyperinflated. There is blunting of the right
lateral and posterior costophrenic angles. There is no alveolar
infiltrate or pleural effusion. The heart and pulmonary vascularity
are normal. There is calcification in the wall of the aortic arch. A
loop recorder is present. The ICD is in stable position.
IMPRESSION: COPD.  Trace right pleural effusion.  No CHF nor pneumonia.

Thoracic aortic atherosclerosis.

## 2017-08-16 ENCOUNTER — Encounter (INDEPENDENT_AMBULATORY_CARE_PROVIDER_SITE_OTHER): Payer: Self-pay | Admitting: Physical Medicine and Rehabilitation

## 2017-08-16 ENCOUNTER — Ambulatory Visit (INDEPENDENT_AMBULATORY_CARE_PROVIDER_SITE_OTHER): Payer: Medicare Other | Admitting: Physical Medicine and Rehabilitation

## 2017-08-16 VITALS — BP 130/67 | HR 81

## 2017-08-16 DIAGNOSIS — M7061 Trochanteric bursitis, right hip: Secondary | ICD-10-CM | POA: Diagnosis not present

## 2017-08-16 DIAGNOSIS — M25551 Pain in right hip: Secondary | ICD-10-CM

## 2017-08-16 DIAGNOSIS — M25511 Pain in right shoulder: Secondary | ICD-10-CM | POA: Diagnosis not present

## 2017-08-16 DIAGNOSIS — G8929 Other chronic pain: Secondary | ICD-10-CM

## 2017-08-16 DIAGNOSIS — M542 Cervicalgia: Secondary | ICD-10-CM

## 2017-08-16 NOTE — Progress Notes (Signed)
Lamount CrankerFlorence Holshouser - 83 y.o. female MRN [id number]  Date of birth: 1934/03/26  Office Visit Note: Visit Date: 08/16/2017 PCP: System, Pcp Not In Referred by: No ref. provider found  Subjective: Chief Complaint  Patient presents with  . Neck - Pain  . Right Shoulder - Pain  . Right Hip - Pain   HPI: Cristy FriedlanderFlorence is a very pleasant 83 year old right-hand dominant female who is my aunt by marriage. We saw her on August 2 with complaints of neck pain and right shoulder pain and what appeared to be either a C6 distribution radicular pain versus trigger point issue. The neck and shoulder pain was on the right and the C6 distribution problem was more than the left hand. We gave her prednisone for 5 days 50 mg and had her see her physical therapist where [facility].  She comes in today after having had a couple of sessions of physical therapy which she felt like was starting to help the neck but she continues to have neck and shoulder pain. She points to specific areas and these areas do show focal triggerpoints were palpated. She denies any real paresthesias or pain down the arms today. No new focal weakness. She does have a history of fibromyalgia and wonders if a lot of this is her fibromyalgia. She does have burning muscle pain really widespread body pain in the upper and lower quadrants. She has not had any new trauma. She is going to continue with physical therapy. She also reports today soreness and pain over the right greater trochanter. No numbness tingling or paresthesia. Pain with laying on that side.    Review of Systems  Constitutional: Positive for malaise/fatigue. Negative for chills, fever and weight loss.  HENT: Negative for hearing loss and sinus pain.   Eyes: Negative for blurred vision, double vision and photophobia.  Respiratory: Negative for cough and shortness of breath.   Cardiovascular: Negative for chest pain, palpitations and leg swelling.  Gastrointestinal: Negative  for abdominal pain, nausea and vomiting.  Genitourinary: Negative for flank pain.  Musculoskeletal: Positive for back pain, joint pain and neck pain. Negative for myalgias.  Skin: Negative for itching and rash.  Neurological: Positive for weakness. Negative for tremors and focal weakness.  Endo/Heme/Allergies: Negative.   Psychiatric/Behavioral: Negative for depression.  All other systems reviewed and are negative.  Otherwise per HPI.  Assessment & Plan: Visit Diagnoses:  1. Cervicalgia   2. Chronic right shoulder pain   3. Pain in right hip   4. Greater trochanteric bursitis, right     Plan: Findings:  Chronic history of neck and shoulder pain which is multifactorial. She does have arthritis of the cervical spine and she may indeed have at times what appears to be more radicular pain but she's not complaining of this today. She started physical therapy but has helped a little bit plus we gave her prednisone which I think they have calmed down the radicular type symptoms. Her clinical history,. By significant fibromyalgia. She's had shoulder impingement signs on the right as well but she's not really having those today. I think the biggest problem is the trigger points in the levator scapula and supraspinatus area and we did inject those today. She is gone follow-up with physical therapy. In terms of her right hip we have completed greater trochanteric injection in the past. This has given her relief. We have not injected the greater trochanter in quite some time and she is tender over this area. This  could be more of a chronic tendinopathy of the gluteus medius and is obviously exacerbated by the fibromyalgia. We did inject this today as well. If it does not help much she'll continue with physical therapy we can also increase that therapy to include the greater trochanter.    Meds & Orders: No orders of the defined types were placed in this encounter.   Orders Placed This Encounter    Procedures  . Large Joint Injection/Arthrocentesis  . Trigger Point Injection    Follow-up: Return if symptoms worsen or fail to improve.   Procedures: Greater trochanteric injection Date/Time: 08/16/2017 11:22 AM Performed by: Tyrell Antonio Authorized by: Tyrell Antonio   Consent Given by:  Parent Site marked: the procedure site was marked   Timeout: prior to procedure the correct patient, procedure, and site was verified   Indications:  Pain and diagnostic evaluation Location:  Hip Site:  R greater trochanter Prep: patient was prepped and draped in usual sterile fashion   Needle Size:  22 G Needle Length:  3.5 inches Approach:  Lateral Ultrasound Guidance: No   Fluoroscopic Guidance: No   Arthrogram: No   Medications:  4 mL bupivacaine 0.25 %; 4 mL lidocaine 2 %; 60 mg triamcinolone acetonide 40 MG/ML Aspiration Attempted: No   Patient tolerance:  Patient tolerated the procedure well with no immediate complications  Greatest area of pain over the greater trochanter was palpated and marked prior to injection. The patient did seem to have relief after the injection. Trigger Point Inj Date/Time: 08/16/2017 11:22 AM Performed by: Tyrell Antonio Authorized by: Tyrell Antonio   Consent Given by:  Patient Site marked: the procedure site was marked   Timeout: prior to procedure the correct patient, procedure, and site was verified   Total # of Trigger Points:  2 Location: neck and back   Needle Size:  25 G Approach:  Dorsal Medications #1:  3 mL lidocaine 1 % Patient tolerance:  Patient tolerated the procedure well with no immediate complications Comments: Triggerpoints were palpated in the right levator scapula as well as supraspinatus. These muscles were injected using a needling technique for trigger points.    No notes on file   Clinical History: CT CERVICAL SPINE WITHOUT CONTRAST  TECHNIQUE: Multidetector CT imaging of the cervical spine was performed  without intravenous contrast. Multiplanar CT image reconstructions were also generated.  COMPARISON:  No priors.  FINDINGS: There are no acute displaced fractures of the cervical spine. Reversal of normal cervical lordosis is noted, centered at the level of C4, presumably either degenerative or positional in nature. Alignment is otherwise anatomic. Prevertebral soft tissues are normal. Severe multilevel degenerative disc disease is identified, most severe at C4-C5, C5-C6 and C6-C7. Multilevel facet arthropathy (left greater than right). Mild scarring in the visualize lung apices. Small nodule noted in the left lobe of the thyroid gland measuring 1.4 x 0.9 cm, nonspecific, but similar to slightly smaller than prior chest CT 12/14/2015, favored to be benign.  IMPRESSION: 1. No acute displaced cervical spine fracture or other acute findings. 2. Severe multilevel degenerative disc disease and cervical spondylosis, as above.   Electronically Signed   By: Trudie Reed M.D.   On: 08/13/2016 13:04  She reports that she has never smoked. She has never used smokeless tobacco. No results for input(s): HGBA1C, LABURIC in the last 8760 hours.  Objective:  VS:  HT:    WT:   BMI:     BP:130/67  HR:81bpm  TEMP: ( )  RESP:  Physical Exam  Constitutional: She is oriented to person, place, and time. She appears well-developed and well-nourished.  Eyes: Pupils are equal, round, and reactive to light. Conjunctivae and EOM are normal.  Cardiovascular: Normal rate and intact distal pulses.   Pulmonary/Chest: Effort normal.  Musculoskeletal:  Patient sits with a forward flexed cervical spine. She does have pain with extension rotation and limited range of motion with right rotation. She has a negative Spurling's test bilaterally. She has a negative Hoffmann's test with good strength in the upper extremities bilaterally. She has mild shoulder impingement signs on the right. This does not  reproduce her pain. She has active trigger points in the trapezius and levator scapula and supraspinatus. Examination of the right hip shows pain with range of motion except some pain with extreme external rotation. She has pain over the right greater trochanter that is concordant with her symptoms. She does have active tender points across the lower back and legs. She has good distal strength.  Neurological: She is alert and oriented to person, place, and time. She exhibits normal muscle tone.  Skin: Skin is warm and dry. No rash noted. No erythema.  Psychiatric: She has a normal mood and affect. Her behavior is normal.  Nursing note and vitals reviewed.   Ortho Exam Imaging: No results found.  Past Medical/Family/Surgical/Social History: Medications & Allergies reviewed per EMR Patient Active Problem List   Diagnosis Date Noted  . Pain of right hip joint 01/10/2017   Past Medical History:  Diagnosis Date  . Arthritis   . Atrial fibrillation (HCC)   . BBB (bundle branch block)   . COPD (chronic obstructive pulmonary disease) (HCC)   . Nodule of left palm    History reviewed. No pertinent family history. Past Surgical History:  Procedure Laterality Date  . BREAST SURGERY    . reveal LINQ cardiac monitor implant     Social History   Occupational History  . Not on file.   Social History Main Topics  . Smoking status: Never Smoker  . Smokeless tobacco: Never Used  . Alcohol use No  . Drug use: No  . Sexual activity: Not on file

## 2017-08-16 NOTE — Progress Notes (Deleted)
Still having neck and shoulder pain. Has been to PT, which helped some. Could tell a big difference after first session. Also having soreness at one spot in right hip.

## 2017-08-16 NOTE — Patient Instructions (Signed)

## 2017-08-22 ENCOUNTER — Encounter (INDEPENDENT_AMBULATORY_CARE_PROVIDER_SITE_OTHER): Payer: Self-pay | Admitting: Physical Medicine and Rehabilitation

## 2017-08-22 MED ORDER — BUPIVACAINE HCL 0.25 % IJ SOLN
4.0000 mL | INTRAMUSCULAR | Status: AC | PRN
  Administered 2017-08-16: 4 mL via INTRA_ARTICULAR

## 2017-08-22 MED ORDER — LIDOCAINE HCL 1 % IJ SOLN
3.0000 mL | INTRAMUSCULAR | Status: AC | PRN
  Administered 2017-08-16: 3 mL

## 2017-08-22 MED ORDER — TRIAMCINOLONE ACETONIDE 40 MG/ML IJ SUSP
60.0000 mg | INTRAMUSCULAR | Status: AC | PRN
  Administered 2017-08-16: 60 mg via INTRA_ARTICULAR

## 2017-08-22 MED ORDER — LIDOCAINE HCL 2 % IJ SOLN
4.0000 mL | INTRAMUSCULAR | Status: AC | PRN
  Administered 2017-08-16: 4 mL

## 2017-11-01 ENCOUNTER — Telehealth (INDEPENDENT_AMBULATORY_CARE_PROVIDER_SITE_OTHER): Payer: Self-pay | Admitting: Physical Medicine and Rehabilitation

## 2017-11-01 NOTE — Telephone Encounter (Signed)
Mailed to patient and patient notified.

## 2017-11-01 NOTE — Telephone Encounter (Signed)
PT script written, she has this done at [facility]

## 2018-05-03 ENCOUNTER — Ambulatory Visit (INDEPENDENT_AMBULATORY_CARE_PROVIDER_SITE_OTHER): Payer: Medicare Other | Admitting: Orthopaedic Surgery

## 2018-05-16 ENCOUNTER — Encounter (INDEPENDENT_AMBULATORY_CARE_PROVIDER_SITE_OTHER): Payer: Self-pay | Admitting: Orthopaedic Surgery

## 2018-05-16 ENCOUNTER — Ambulatory Visit (INDEPENDENT_AMBULATORY_CARE_PROVIDER_SITE_OTHER): Payer: Medicare Other

## 2018-05-16 ENCOUNTER — Ambulatory Visit (INDEPENDENT_AMBULATORY_CARE_PROVIDER_SITE_OTHER): Payer: Medicare Other | Admitting: Orthopaedic Surgery

## 2018-05-16 DIAGNOSIS — M25572 Pain in left ankle and joints of left foot: Secondary | ICD-10-CM

## 2018-05-16 DIAGNOSIS — M76822 Posterior tibial tendinitis, left leg: Secondary | ICD-10-CM

## 2018-05-16 NOTE — Progress Notes (Signed)
Office Visit Note   Patient: Ashley Mccall           Date of Birth: 30-Aug-1934           MRN: [id number] Visit Date: 05/16/2018              Requested by: No referring provider defined for this encounter. PCP: System, Pcp Not In   Assessment & Plan: Visit Diagnoses:  1. Pain in left ankle and joints of left foot   2. Posterior tibial tendonitis, left     Plan: I do think that she would benefit from formal physical therapy which can be done at her nursing facility.  I gave her a note for this.  I will have him work on quad strengthening exercises as well as any modalities to help her left ankle and to strengthen her lower extremities in general.  All questions concerns were answered and addressed.  Follow-up will be as needed.  Follow-Up Instructions: Return if symptoms worsen or fail to improve.   Orders:  Orders Placed This Encounter  Procedures  . XR Ankle Complete Left   No orders of the defined types were placed in this encounter.     Procedures: No procedures performed   Clinical Data: No additional findings.   Subjective: Chief Complaint  Patient presents with  . Left Ankle - Pain  Patient comes in today with left ankle pain.  It has been hurting for about a year with no known injury.  Just bothers her a lot from time to time she states.  She also has bilateral knee pain and bilateral hip pain she points the trochanteric area of both her hips the source of her pain in the medial aspect of both her knees a source of her pain.  Is the medial ankle on the left side is bothering her as well.  She is a very active individual and does not use an assistive device to walk around.  She stays in the independent section of the skilled nursing facility but does have physical therapy added.  HPI  Review of Systems She currently denies any headache, chest pain, shortness of breath, fever,  chills, nausea, vomiting.  Objective: Vital Signs: There were no vitals taken for this visit.  Physical Exam She is alert and oriented x3 and in no acute distress Ortho Exam Examination of both hips show full and fluid range of motion with minimal pain of the trochanteric area of both hips.  Both knees have slight flexion contractures with medial joint line tenderness but full range of motion overall.  She does have some slightly weak quad muscles.  I had her lay supine on the table and her leg lengths are equal.  She does have some pain over the posterior tibial tendon but no dysfunction of this.  She can go up on her toes but is painful to do so. Specialty Comments:  No specialty comments available.  Imaging: Xr Ankle Complete Left  Result Date: 05/16/2018 3 views of the left ankle show no acute findings.    PMFS History: Patient Active Problem List   Diagnosis Date Noted  . Pain of right hip joint 01/10/2017   Past Medical History:  Diagnosis Date  . Arthritis   .  Atrial fibrillation (HCC)   . BBB (bundle branch block)   . COPD (chronic obstructive pulmonary disease) (HCC)   . Nodule of left palm     History reviewed. No pertinent family history.  Past Surgical History:  Procedure Laterality Date  . BREAST SURGERY    . reveal LINQ cardiac monitor implant     Social History   Occupational History  . Not on file  Tobacco Use  . Smoking status: Never Smoker  . Smokeless tobacco: Never Used  Substance and Sexual Activity  . Alcohol use: No  . Drug use: No  . Sexual activity: Not on file

## 2018-05-24 ENCOUNTER — Encounter (HOSPITAL_BASED_OUTPATIENT_CLINIC_OR_DEPARTMENT_OTHER): Payer: Self-pay | Admitting: *Deleted

## 2018-05-24 ENCOUNTER — Other Ambulatory Visit: Payer: Self-pay

## 2018-05-24 ENCOUNTER — Emergency Department (HOSPITAL_BASED_OUTPATIENT_CLINIC_OR_DEPARTMENT_OTHER): Payer: Medicare Other

## 2018-05-24 ENCOUNTER — Emergency Department (HOSPITAL_BASED_OUTPATIENT_CLINIC_OR_DEPARTMENT_OTHER)
Admission: EM | Admit: 2018-05-24 | Discharge: 2018-05-24 | Disposition: A | Discharge status: Home / Self Care | Payer: Medicare Other | Attending: Emergency Medicine | Admitting: Emergency Medicine

## 2018-05-24 DIAGNOSIS — R0789 Other chest pain: Secondary | ICD-10-CM | POA: Diagnosis not present

## 2018-05-24 DIAGNOSIS — Z7982 Long term (current) use of aspirin: Secondary | ICD-10-CM | POA: Insufficient documentation

## 2018-05-24 DIAGNOSIS — J449 Chronic obstructive pulmonary disease, unspecified: Secondary | ICD-10-CM | POA: Diagnosis not present

## 2018-05-24 DIAGNOSIS — Z79899 Other long term (current) drug therapy: Secondary | ICD-10-CM | POA: Insufficient documentation

## 2018-05-24 MED ORDER — BACITRACIN ZINC 500 UNIT/GM EX OINT: TOPICAL_OINTMENT | Freq: Two times a day (BID) | CUTANEOUS | Status: DC

## 2018-05-24 NOTE — ED Notes (Signed)
ED Provider at bedside. 

## 2018-05-24 NOTE — ED Notes (Signed)
No bruised noted.  Open skin area to mid chest close to the throat measures 1.0 cm x 1.0 cm.  Pain to RUQ verbalized when she moves.
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
## 2018-05-24 NOTE — ED Notes (Signed)
IS given to patient. Stated she had used them many times in the past.

## 2018-09-15 ENCOUNTER — Ambulatory Visit (INDEPENDENT_AMBULATORY_CARE_PROVIDER_SITE_OTHER): Payer: Self-pay

## 2018-09-15 ENCOUNTER — Encounter (INDEPENDENT_AMBULATORY_CARE_PROVIDER_SITE_OTHER): Payer: Self-pay | Admitting: Physical Medicine and Rehabilitation

## 2018-09-15 ENCOUNTER — Ambulatory Visit (INDEPENDENT_AMBULATORY_CARE_PROVIDER_SITE_OTHER): Payer: Medicare Other | Admitting: Physical Medicine and Rehabilitation

## 2018-09-15 VITALS — BP 138/69 | HR 90

## 2018-09-15 DIAGNOSIS — M79671 Pain in right foot: Secondary | ICD-10-CM | POA: Diagnosis not present

## 2018-09-15 DIAGNOSIS — M797 Fibromyalgia: Secondary | ICD-10-CM

## 2018-09-15 DIAGNOSIS — M545 Low back pain, unspecified: Secondary | ICD-10-CM

## 2018-09-15 DIAGNOSIS — M79672 Pain in left foot: Secondary | ICD-10-CM

## 2018-09-15 DIAGNOSIS — M47816 Spondylosis without myelopathy or radiculopathy, lumbar region: Secondary | ICD-10-CM

## 2018-09-15 NOTE — Progress Notes (Signed)
Numeric Pain Rating Scale and Functional Assessment Average Pain 5 Sitting right now very mild about a 2-3 increased when leaning forward  In the last MONTH (on 0-10 scale) has pain interfered with the following?  1. General activity like being  able to carry out your everyday physical activities such as walking, climbing stairs, carrying groceries, or moving a chair?  Rating(5)  She is able to get around today, however cannot do house work due to pain.   +Driver, +BT, -Dye Allergies.

## 2018-09-21 ENCOUNTER — Encounter (INDEPENDENT_AMBULATORY_CARE_PROVIDER_SITE_OTHER): Payer: Self-pay | Admitting: Physical Medicine and Rehabilitation

## 2018-09-21 DIAGNOSIS — M797 Fibromyalgia: Secondary | ICD-10-CM | POA: Insufficient documentation

## 2018-09-21 DIAGNOSIS — M47816 Spondylosis without myelopathy or radiculopathy, lumbar region: Secondary | ICD-10-CM | POA: Insufficient documentation

## 2018-09-21 NOTE — Progress Notes (Signed)
Ashley Mccall - 84 y.o. female MRN [id number]  Date of birth: 05-16-1934  Office Visit Note: Visit Date: 09/15/2018 PCP: Henri Medal, MD Referred by: Henri Medal, MD  Subjective: Chief Complaint  Patient presents with  . Lower Back - Pain  . Right Foot - Pain  . Left Foot - Pain   HPI: Ashley Mccall is a 84 y.o. female who comes in today with 2 distinct complaints.  She reports acute onset of severe lower back pain on Monday of this week.  She reports waking up and really could not stand up straight it was in a severe amount of pain.  She reports no specific trauma or recent falls.  She does report that they are working on [place] where she lives and she is not getting good sleep.  She reports going to sleep more of a fetal position and waking up in the same position really could not move because of the stiffness and pain.  She did not report any pain really down the legs per se.  Over the course of the week this has loosened up to a degree but she is still feeling pain in the lumbar region.  She has had no focal weakness or bowel or bladder changes or fevers chills or night sweats.  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  She does have a history of fibromyalgia which is an underlying issue in terms of her pain complaints.  She still fairly active and has had recent cardiac issues but doing fairly well otherwise.  She has not had recent physical therapy.  She is really not tolerant of most medications.  She has not had recent x-rays but does have a prior MRI from several years ago that we did review.  Plain film x-rays were taken today.  This is mainly [place] the severity of the pain complaints and the patient desired.  Her second complaint is bilateral foot pain with coldness and color change.  She is not a diabetic.  She has no history of polyneuropathy.  Again she does have fibromyalgia.   She reports chronic worsening bilateral foot pain.  She has had no specific trauma.  Somewhat vague area of pain but mostly in the ankle and plantar aspect of the foot.  She really is just asking who she can see for her feet.    Review of Systems  Constitutional: Negative for chills, fever, malaise/fatigue and weight loss.  HENT: Negative for hearing loss and sinus pain.   Eyes: Negative for blurred vision, double vision and photophobia.  Respiratory: Negative for cough and shortness of breath.   Cardiovascular: Negative for chest pain, palpitations and leg swelling.  Gastrointestinal: Negative for abdominal pain, nausea and vomiting.  Genitourinary: Negative for flank pain.  Musculoskeletal: Positive for back pain and joint pain. Negative for myalgias.       Bilateral foot pain  Skin: Negative for itching and rash.  Neurological: Negative for tremors, focal weakness and weakness.  Endo/Heme/Allergies: Negative.   Psychiatric/Behavioral: Negative for depression.  All other systems reviewed and are negative.  Otherwise per HPI.  Assessment & Plan: Visit Diagnoses:  1. Acute bilateral low back pain without sciatica   2. Spondylosis without myelopathy or radiculopathy, lumbar region   3. Bilateral foot pain   4. Fibromyalgia     Plan: Findings:  Acute flareup of low back pain on Monday of this  week with some reduction of symptoms over the course of the week to the point where she is upright and mobile.  She reports severe pain for the first 2 days.  No other findings around this event.  I think this is a situation that will call a sprain strain type of activity and it could have just been from sleeping in a wrong position and not getting enough sleep over the last many days because of work on her apartment area.  Exam does not show any red flag problems.  I think the best approach is to have her see her physical therapist at [facility] for modalities and manual treatment and will watch her  over the next several weeks.  If things were to change we could obviously look at potential for injection treatment of prednisone or update MRI depending on where she has with her symptoms.  She is in agreement with this plan.  In terms of her feet brief exam shows no swelling or deformity.  She has some generalized tenderness but does have a history of fibromyalgia.  She feels like her feet are just worsening in terms of pain and coldness.  Could be a polyneuropathy or small fiber neuropathy.  No real signs of Morton's neuroma.  In terms of evaluation I want her to see Dr. Lajoyce Corners or his assistant for evaluation just to rule out any orthopedic problem and they can maybe give her an idea of who she could see otherwise for her feet.    Meds & Orders: No orders of the defined types were placed in this encounter.   Orders Placed This Encounter  Procedures  . XR Lumbar Spine 2-3 Views    Follow-up: Return if symptoms worsen or fail to improve.   Procedures: No procedures performed  No notes on file   Clinical History: CT CERVICAL SPINE WITHOUT CONTRAST  TECHNIQUE: Multidetector CT imaging of the cervical spine was performed without intravenous contrast. Multiplanar CT image reconstructions were also generated.  COMPARISON: No priors.  FINDINGS: There are no acute displaced fractures of the cervical spine. Reversal of normal cervical lordosis is noted, centered at the level of C4, presumably either degenerative or positional in nature. Alignment is otherwise anatomic. Prevertebral soft tissues are normal. Severe multilevel degenerative disc disease is identified, most severe at C4-C5, C5-C6 and C6-C7. Multilevel facet arthropathy (left greater than right). Mild scarring in the visualize lung apices. Small nodule noted in the left lobe of the thyroid gland measuring 1.4 x 0.9 cm, nonspecific, but similar to slightly smaller than prior chest CT 12/14/2015, favored to be  benign.  IMPRESSION: 1. No acute displaced cervical spine fracture or other acute findings. 2. Severe multilevel degenerative disc disease and cervical spondylosis, as above.   Electronically Signed By: Trudie Reed M.D. On: 08/13/2016 13:04  MRI Lumbar Spine 2013 MRI LUMBAR SPINE WITHOUT CONTRAST   Technique: Multiplanar and multiecho pulse sequences of the lumbar spine were obtained without intravenous contrast.   Comparison: None.   Findings: Last fully open disc space is labeled L5-S1. Present examination incorporates from T10-11 disc space through the lower sacrum.   Conus T12-L1 level.   Atherosclerotic type changes of the aorta. Slight celiac artery narrowing.   L3 superior endplate compression fracture with minimal loss of height and moderate amount of surrounding edema greater to the right of midline. Appearance is suggestive of a benign osteoporotic compression fracture rather than pathologic fracture. If the patient had an atypical course for that of a  simple benign osteoporotic compression fracture, follow-up imaging to exclude the less likely consideration of pathologic fracture may then be considered.   Per CMS PQRI reporting requirements (PQRI Measure 24): Given the patient's age of greater than 50 and the fracture site (hip, distal radius, or spine), the patient should be tested for osteoporosis using DXA, and the appropriate treatment considered based on the DXA results..   T10-11 through T12-L1 unremarkable.   L1-2: Mild bulge. Mild facet joint degenerative changes.   L2-3: Moderate facet joint degenerative changes. Bulge with superimposed broad-based left posterior lateral/foraminal small disc protrusion with minimal impression upon the exiting left L2 nerve root. Slight impression on the left ventral aspect of thecal sac with mild left-sided spinal stenosis.   L4-5: Moderate facet joint degenerative changes. 2 mm anterior slip of L3.  Bulge. Multifactorial mild to slightly moderate spinal stenosis.   L4-5: Facet joint degenerative changes. Disc degeneration. Mild bulge. Very mild spinal stenosis.   L5-S1: Prominent disc degeneration with broad-based disc osteophyte complex extending into the inferior aspect of the neural foramen bilaterally with mild encroachment upon the exiting L5 nerve roots. No central stenosis.   IMPRESSION: L3 superior endplate compression fracture as detailed above.   L2-3 bulge with superimposed broad-based left posterior lateral/foraminal small disc protrusion with minimal impression upon the exiting left L2 nerve root. Slight impression on the left ventral aspect of thecal sac with mild left-sided spinal stenosis.   L4-5 multifactorial l mild to slightly moderate spinal stenosis.   L4-5. mild spinal stenosis.   L5-S1 broad-based disc osteophyte complex extending into the inferior aspect of the neural foramen bilaterally with mild encroachment upon the exiting L5 nerve roots. No central stenosis.   She reports that she has never smoked. She has never used smokeless tobacco. No results for input(s): HGBA1C, LABURIC in the last 8760 hours.  Objective:  VS:  HT:    WT:   BMI:     BP:138/69  HR:90bpm  TEMP: ( )  RESP:99 % Physical Exam  Constitutional: She is oriented to person, place, and time. She appears well-developed and well-nourished. No distress.  HENT:  Head: Normocephalic and atraumatic.  Nose: Nose normal.  Mouth/Throat: Oropharynx is clear and moist.  Eyes: Pupils are equal, round, and reactive to light. Conjunctivae are normal.  Neck: Normal range of motion. Neck supple.  Cardiovascular: Regular rhythm and intact distal pulses.  Pulmonary/Chest: Effort normal. No respiratory distress.  Abdominal: She exhibits no distension. There is no guarding.  Musculoskeletal:  Patient stands with forward flexed lumbar spine she does have difficulty to full extension.  No pain  with hip rotation.  She has good distal strength without any deficits bilaterally.  Some pain over the PSIS bilaterally and greater trochanter.  Positive tender points.  Quick examination of both feet shows no atrophy other than age-related atrophy and no edema.  Her feet do feel cold but there is good pulses bilaterally at the dorsalis pedis and posterior tib.  She has no real pain with squeezing the metatarsals.  Some tenderness over the plantar fascia bilaterally.  Neurological: She is alert and oriented to person, place, and time. She exhibits normal muscle tone. Coordination normal.  Skin: Skin is warm. No rash noted. No erythema.  Psychiatric: She has a normal mood and affect. Her behavior is normal.  Nursing note and vitals reviewed.   Ortho Exam Imaging: No results found.  Past Medical/Family/Surgical/Social History: Medications & Allergies reviewed per EMR, new medications updated. Patient Active Problem  List   Diagnosis Date Noted  . Spondylosis without myelopathy or radiculopathy, lumbar region 09/21/2018  . Fibromyalgia 09/21/2018  . Pain of right hip joint 01/10/2017   Past Medical History:  Diagnosis Date  . Arthritis   . Atrial fibrillation (HCC)   . BBB (bundle branch block)   . COPD (chronic obstructive pulmonary disease) (HCC)   . Nodule of left palm    History reviewed. No pertinent family history. Past Surgical History:  Procedure Laterality Date  . BREAST SURGERY    . reveal LINQ cardiac monitor implant     Social History   Occupational History  . Not on file  Tobacco Use  . Smoking status: Never Smoker  . Smokeless tobacco: Never Used  Substance and Sexual Activity  . Alcohol use: No  . Drug use: No  . Sexual activity: Not on file

## 2018-09-26 ENCOUNTER — Ambulatory Visit (INDEPENDENT_AMBULATORY_CARE_PROVIDER_SITE_OTHER): Payer: Medicare Other | Admitting: Orthopedic Surgery

## 2018-09-26 ENCOUNTER — Encounter (INDEPENDENT_AMBULATORY_CARE_PROVIDER_SITE_OTHER): Payer: Self-pay | Admitting: Orthopedic Surgery

## 2018-09-26 VITALS — Ht 64.0 in | Wt 126.3 lb

## 2018-09-26 DIAGNOSIS — M79671 Pain in right foot: Secondary | ICD-10-CM

## 2018-09-26 DIAGNOSIS — M79672 Pain in left foot: Secondary | ICD-10-CM | POA: Diagnosis not present

## 2018-09-26 NOTE — Progress Notes (Signed)
Office Visit Note   Patient: Ashley Mccall           Date of Birth: [birth year]           MRN: [id number] Visit Date: 09/26/2018              Requested by: Henri Medal, MD [id number] [place], [place] [address] PCP: Henri Medal, MD  Chief Complaint  Patient presents with  . Right Foot - Pain    C/o coldness  . Left Foot - Pain      HPI: Patient is an 84 year old woman who presents with pain and coldness in the forefoot bilaterally.  Patient feels like she has hypertrophic callus across the toes has burning and tingling at the end of the day and states that her feet feel cold.  Assessment & Plan: Visit Diagnoses:  1. Bilateral foot pain     Plan: Patient was given instructions and demonstrated Achilles stretching she was able to reproduce the stretches.  Recommended knee-high 15 to 20 mm compression stocking size small for her venous insufficiency.  Recommended physical therapy at [facility] burn for Achilles stretching and strengthening of the lower extremities.  Follow-Up Instructions: Return if symptoms worsen or fail to improve.   Ortho Exam  Patient is alert, oriented, no adenopathy, well-dressed, normal affect, normal respiratory effort. Examination patient has a good dorsalis pedis pulse bilaterally she has significant heel cord contracture with dorsiflexion 10 degrees short of neutral bilaterally.  She has callus across the forefoot secondary to the heel cord contracture there is no ulcers no ischemic changes patient denies any Raynaud's symptoms such as whiteness or blue discoloration of the toes.  Patient has no ischemic ulcers.  Patient's calf is 30 cm in circumference this will be a size small sock.  Imaging: No results found. No images are attached to the encounter.  Labs: No results found for: HGBA1C, ESRSEDRATE, CRP, LABURIC, REPTSTATUS, GRAMSTAIN, CULT, LABORGA   Lab Results  Component Value Date   ALBUMIN 3.6 07/13/2015     Body mass index is 21.68 kg/m.  Orders:  No orders of the defined types were placed in this encounter.  No orders of the defined types were placed in this encounter.    Procedures: No procedures performed  Clinical Data: No additional findings.  ROS:  All other systems negative, except as noted in the HPI. Review of Systems  Objective: Vital Signs: Ht 5\' 4"  (1.626 m)   Wt 126 lb 5.1 oz (57.3 kg)   BMI 21.68 kg/m   Specialty Comments:  No specialty comments available.  PMFS History: Patient Active Problem List   Diagnosis Date Noted  . Spondylosis without myelopathy or radiculopathy, lumbar region 09/21/2018  . Fibromyalgia 09/21/2018  . Pain of right hip joint 01/10/2017   Past Medical History:  Diagnosis Date  . Arthritis   . Atrial fibrillation (HCC)   . BBB (bundle branch block)   . COPD (chronic obstructive pulmonary disease) (HCC)   . Nodule of left palm     History reviewed. No pertinent family history.  Past Surgical History:  Procedure Laterality Date  . BREAST SURGERY    . reveal LINQ cardiac monitor implant     Social History   Occupational History  . Not on file  Tobacco Use  . Smoking status: Never Smoker  . Smokeless tobacco: Never Used  Substance and Sexual Activity  . Alcohol use: No  . Drug use: No  .  Sexual activity: Not on file

## 2018-10-20 ENCOUNTER — Telehealth (INDEPENDENT_AMBULATORY_CARE_PROVIDER_SITE_OTHER): Payer: Self-pay | Admitting: *Deleted

## 2018-10-20 NOTE — Telephone Encounter (Signed)
Please call her and see if Dr. Prince Rome can see her before then, he will do better with her knee, he may have seen her in past I will talk to him.

## 2018-10-24 ENCOUNTER — Encounter (INDEPENDENT_AMBULATORY_CARE_PROVIDER_SITE_OTHER): Payer: Self-pay | Admitting: Family Medicine

## 2018-10-24 ENCOUNTER — Ambulatory Visit (INDEPENDENT_AMBULATORY_CARE_PROVIDER_SITE_OTHER): Payer: Self-pay

## 2018-10-24 ENCOUNTER — Ambulatory Visit (INDEPENDENT_AMBULATORY_CARE_PROVIDER_SITE_OTHER): Payer: Medicare Other | Admitting: Family Medicine

## 2018-10-24 DIAGNOSIS — M25561 Pain in right knee: Secondary | ICD-10-CM | POA: Diagnosis not present

## 2018-10-24 MED ORDER — METHYLPREDNISOLONE ACETATE 40 MG/ML IJ SUSP: 40.0000 mg | Freq: Once | INTRAMUSCULAR | Status: AC

## 2018-10-24 MED ORDER — METHYLPREDNISOLONE ACETATE 40 MG/ML IJ SUSP
80.0000 mg | INTRAMUSCULAR | Status: AC | PRN
  Administered 2018-10-24: 80 mg

## 2018-10-24 MED ORDER — LIDOCAINE HCL 1 % IJ SOLN
5.0000 mL | INTRAMUSCULAR | Status: AC | PRN
  Administered 2018-10-24: 5 mL

## 2018-10-24 NOTE — Progress Notes (Signed)
   Office Visit Note   Patient: Ashley Mccall           Date of Birth: 1934/03/22           MRN: [id number] Visit Date: 10/24/2018 Requested by: Ashley MedalMoreira, Fernanda I, MD [address] [place], [place] [id number] PCP: Ashley LombardMoreira, Fernanda I, MD  Subjective: Chief Complaint  Patient presents with  . Right Knee - Pain    Worse pain over the past 2 months - medial aspect. Swells.  NKI.    HPI: She is here with right knee pain.  Long-standing history of arthritis, in the past she has done well with cortisone injections.  She requests one today.  No trauma, no locking or giving way.  Pain on the anterior aspect.              ROS: Otherwise noncontributory  Objective: Vital Signs: There were no vitals taken for this visit.  Physical Exam:  Right knee: 1+ effusion, no warmth or erythema.  Tender at the medial patellofemoral joint and medial joint line.  1+ patellofemoral crepitus.  Full extension, flexion of 120 degrees.  Imaging: Right knee x-rays: Moderate lateral compartment greater than medial compartment DJD.  Chondrocalcinosis is evident as well.  No sign of fracture.  Assessment & Plan: 1.  Right knee pain with effusion, probable aggravation of DJD versus CPPD -Discussed options with her and elected to proceed with cortisone injection.  Follow-up as needed.   Follow-Up Instructions: No follow-ups on file.      Procedures: Large Joint Inj on 10/24/2018 11:24 AM Details: 25 G 1.5 in needle, lateral approach  Arthrogram: No  Medications: 5 mL lidocaine 1 %; 80 mg methylPREDNISolone acetate 40 MG/ML Aspirate: clear and yellow  A flash of blood-tinged synovial fluid obtained prior to injection. Consent was given by the patient.      No notes on file    PMFS History: Patient Active Problem List   Diagnosis Date Noted  . Spondylosis without myelopathy or radiculopathy, lumbar region 09/21/2018  . Fibromyalgia 09/21/2018  . Pain of right hip joint 01/10/2017   Past  Medical History:  Diagnosis Date  . Arthritis   . Atrial fibrillation (HCC)   . BBB (bundle branch block)   . COPD (chronic obstructive pulmonary disease) (HCC)   . Nodule of left palm     History reviewed. No pertinent family history.  Past Surgical History:  Procedure Laterality Date  . BREAST SURGERY    . reveal LINQ cardiac monitor implant     Social History   Occupational History  . Not on file  Tobacco Use  . Smoking status: Never Smoker  . Smokeless tobacco: Never Used  Substance and Sexual Activity  . Alcohol use: No  . Drug use: No  . Sexual activity: Not on file

## 2018-10-24 NOTE — Progress Notes (Deleted)
  Ashley Mccall - 84 y.o. female MRN [id number]  Date of birth: [birth year]    SUBJECTIVE:      Chief Complaint:/ HPI:   ***   ROS:     See HPI  PERTINENT  PMH / PSH FH / / SH:  Past Medical, Surgical, Social, and Family History Reviewed & Updated in the EMR.  Pertinent findings include:  ***  OBJECTIVE: There were no vitals taken for this visit.  Physical Exam:  Vital signs are reviewed.  GEN: Alert and oriented, NAD Pulm: Breathing unlabored PSY: normal mood, congruent affect  MSK: ***  ASSESSMENT & PLAN:  1. ***
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
## 2019-01-08 ENCOUNTER — Telehealth (INDEPENDENT_AMBULATORY_CARE_PROVIDER_SITE_OTHER): Payer: Self-pay | Admitting: Physical Medicine and Rehabilitation

## 2019-01-08 NOTE — Telephone Encounter (Signed)
Patient called and wanted to let Dr. Alvester Morin to know that she is having trouble with her Right Knee.  CB#[phone].  Thank you.

## 2019-01-09 NOTE — Telephone Encounter (Signed)
See if Dr. Prince Rome can see, I will speak with him

## 2019-01-09 NOTE — Telephone Encounter (Signed)
Please advise 

## 2019-01-16 ENCOUNTER — Ambulatory Visit (INDEPENDENT_AMBULATORY_CARE_PROVIDER_SITE_OTHER): Payer: Medicare Other | Admitting: Family Medicine

## 2019-01-16 ENCOUNTER — Encounter (INDEPENDENT_AMBULATORY_CARE_PROVIDER_SITE_OTHER): Payer: Self-pay | Admitting: Family Medicine

## 2019-01-16 DIAGNOSIS — M25561 Pain in right knee: Secondary | ICD-10-CM | POA: Diagnosis not present

## 2019-01-16 DIAGNOSIS — M25562 Pain in left knee: Secondary | ICD-10-CM

## 2019-01-16 MED ORDER — METHYLPREDNISOLONE ACETATE 40 MG/ML IJ SUSP: 40.0000 mg | Freq: Once | INTRAMUSCULAR | Status: AC

## 2019-01-16 NOTE — Progress Notes (Signed)
   Office Visit Note   Patient: Ashley Mccall           Date of Birth: 01-07-1934           MRN: [id number] Visit Date: 01/16/2019 Requested by: Henri Medal, MD [address] [place], [place] [address] PCP: Henri Medal, MD  Subjective: Chief Complaint  Patient presents with  . Left Knee - Pain    States that both knees are very sore  . Right Knee - Pain    HPI: Here with bilateral knee pain, requesting injections.  Last one helped about a week.  Pain on medial aspect of each knee.              ROS: [place]  Objective: Vital Signs: There were no vitals taken for this visit.  Physical Exam:  Knees:  Trace effusion, no warmth/erythema.  Right knee tender at medial patellofemoral joint, and left knee near pes bursa area.  Imaging: None  Assessment & Plan: 1.  Bilateral chronic knee pain due to DJD. - Inject both today.  RTC prn.   Follow-Up Instructions: Return if symptoms worsen or fail to improve.      Procedures: Bilateral knee injections: After sterile prep with Betadine, injected 3 cc 1% lidocaine without epinephrine and 40 mg methylprednisolone from medial midpatellar approach right knee, and into the Pes bursa area of left knee.   PMFS History: Patient Active Problem List   Diagnosis Date Noted  . Spondylosis without myelopathy or radiculopathy, lumbar region 09/21/2018  . Fibromyalgia 09/21/2018  . Pain of right hip joint 01/10/2017   Past Medical History:  Diagnosis Date  . Arthritis   . Atrial fibrillation (HCC)   . BBB (bundle branch block)   . COPD (chronic obstructive pulmonary disease) (HCC)   . Nodule of left palm     History reviewed. No pertinent family history.  Past Surgical History:  Procedure Laterality Date  . BREAST SURGERY    . reveal LINQ cardiac monitor implant     Social History   Occupational History  . Not on file  Tobacco Use  . Smoking status: Never Smoker  . Smokeless tobacco: Never Used    Substance and Sexual Activity  . Alcohol use: No  . Drug use: No  . Sexual activity: Not on file

## 2019-01-18 ENCOUNTER — Ambulatory Visit (INDEPENDENT_AMBULATORY_CARE_PROVIDER_SITE_OTHER): Payer: Medicare Other | Admitting: Family Medicine

## 2019-08-14 ENCOUNTER — Ambulatory Visit (INDEPENDENT_AMBULATORY_CARE_PROVIDER_SITE_OTHER): Payer: Medicare Other | Admitting: Family Medicine

## 2019-08-14 ENCOUNTER — Encounter: Payer: Self-pay | Admitting: Family Medicine

## 2019-08-14 DIAGNOSIS — M25561 Pain in right knee: Secondary | ICD-10-CM

## 2019-08-14 DIAGNOSIS — M25562 Pain in left knee: Secondary | ICD-10-CM

## 2019-08-14 NOTE — Progress Notes (Signed)
I saw and examined the patient with Dr. Mayer Masker and agree with assessment and plan as outlined.  Both knees injected in pes bursa area, and right knee injected from medial mid-patellar approach.  RTC as needed.

## 2019-08-14 NOTE — Progress Notes (Signed)
Ashley Mccall - 85 y.o. female MRN [id number]  Date of birth: [birth year]  Office Visit Note: Visit Date: 08/14/2019 PCP: Lauraine Rinne, MD Referred by: Lauraine Rinne, MD  Subjective: Chief Complaint  Patient presents with  . Right Knee - Pain    Right knee > left knee pain. Had both knees injected with cortisone 01/16/19. They worked well for the first 4 months. Over the past month, the pain has been worse at end of day (ok in the morning).  . Left Knee - Pain   HPI: Ashley Mccall is a 85 y.o. female who comes in today with bilateral knee pain, right worse than left. Had both knees injected with cortisone on February 4th (both intra-articular and pes bursa) and improved pain for 4 months. Pain has gradually worsened over the past month with swelling, pain with all movements. Requesting repeat injection today.   HPI ROS Otherwise per HPI.  Assessment & Plan: Visit Diagnoses:  1. Left knee pain, unspecified chronicity   2. Right knee pain, unspecified chronicity     Plan: - bilateral pes bursa injected with cortisone - right knee intra-articular injection from medial mid-patellar approach - recommended Voltaren gel for tender areas - return PRN  Meds & Orders: No orders of the defined types were placed in this encounter.  No orders of the defined types were placed in this encounter.   Follow-up: PRN  Procedures: Right knee injected at pes bursa and medial infrapatellar. Injection sites sterilely prepped with Betadine.  2 cc lidocaine and 20 mg methylprednisolone injected into pes bursa, 3 cc lidocaine and 40 mg methylprednisolone injected from medial midpatellar approach. Patient tolerated procedure well.  Left knee injected at pes bursa. Sterilely prepped with betadine.  2.5 cc lidocaine and 40 mg methylprednisolone injected into pes bursa. Patient tolerated procedure well.   No notes on file   Clinical History: No specialty comments available.   She  reports that she has never smoked. She has never used smokeless tobacco. No results for input(s): HGBA1C, LABURIC in the last 8760 hours.  Objective:  VS:  HT:    WT:   BMI:     BP:   HR: bpm  TEMP: ( )  RESP:  Physical Exam  PHYSICAL EXAM: Gen: NAD, alert, cooperative with exam, well-appearing HEENT: clear conjunctiva,  CV:  no edema, capillary refill brisk, normal rate Resp: non-labored Skin: no rashes, normal turgor  Neuro: no gross deficits.  Psych:  alert and oriented  Ortho Exam  Right Knee: - Inspection: 1+ effusion. Skin intact - Palpation: exquisitely TTP at pes bursa, TTP along medial joint line  - ROM: reduced flexion to 100, full extension in knee and hip - Strength: 5/5 strength - Neuro/vasc: NV intact - Special Tests: - LIGAMENTS: negative anterior and posterior drawer, negative Lachman's, no MCL or LCL laxity  -- MENISCUS: negative McMurray's  Left knee: - Inspection: 1+ effusion. Skin intact - Palpation: exquisitely TTP at pes bursa, TTP along medial joint line  - ROM: reduced flexion to 100, full extension in knee and hip - Strength: 5/5 strength - Neuro/vasc: NV intact - Special Tests: - LIGAMENTS: negative anterior and posterior drawer, negative Lachman's, no MCL or LCL laxity  -- MENISCUS: negative McMurray's  Imaging: No results found.  Past Medical/Family/Surgical/Social History: Medications & Allergies reviewed per EMR, new medications updated. Patient Active Problem List   Diagnosis Date Noted  . Spondylosis without myelopathy or radiculopathy, lumbar region 09/21/2018  . Fibromyalgia 09/21/2018  .  Pain of right hip joint 01/10/2017   Past Medical History:  Diagnosis Date  . Arthritis   . Atrial fibrillation (HCC)   . BBB (bundle branch block)   . COPD (chronic obstructive pulmonary disease) (HCC)   . Nodule of left palm    History reviewed. No pertinent family history. Past Surgical History:  Procedure Laterality Date  . BREAST  SURGERY    . reveal LINQ cardiac monitor implant     Social History   Occupational History  . Not on file  Tobacco Use  . Smoking status: Never Smoker  . Smokeless tobacco: Never Used  Substance and Sexual Activity  . Alcohol use: No  . Drug use: No  . Sexual activity: Not on file

## 2019-08-17 ENCOUNTER — Ambulatory Visit: Payer: Medicare Other | Admitting: Family Medicine

## 2020-02-13 ENCOUNTER — Encounter: Payer: Self-pay | Admitting: Family Medicine

## 2020-02-13 ENCOUNTER — Ambulatory Visit (INDEPENDENT_AMBULATORY_CARE_PROVIDER_SITE_OTHER): Payer: Medicare Other | Admitting: Family Medicine

## 2020-02-13 ENCOUNTER — Other Ambulatory Visit: Payer: Self-pay

## 2020-02-13 DIAGNOSIS — M25561 Pain in right knee: Secondary | ICD-10-CM | POA: Diagnosis not present

## 2020-02-13 DIAGNOSIS — G8929 Other chronic pain: Secondary | ICD-10-CM | POA: Diagnosis not present

## 2020-02-13 DIAGNOSIS — M25562 Pain in left knee: Secondary | ICD-10-CM

## 2020-02-13 DIAGNOSIS — M25551 Pain in right hip: Secondary | ICD-10-CM

## 2020-02-13 NOTE — Progress Notes (Signed)
    SUBJECTIVE:   CHIEF COMPLAINT / HPI:  Patient presenting with bilateral knee pain, left worse than right.  She reports that her pain has been worsening recently and has not been in for knee injections for some time (last appt 08/14/19).  She reports that certain positions cause her knee pain to be worse.  They do not hurt as much when she is walking.  She denies any recent falls.  PERTINENT  PMH / PSH: right hip pain, lumbar spondylosis, fibromyalgia  OBJECTIVE:  General: Well-appearing female, no acute distress.  Well-dressed well-groomed. Knees: No erythema, rash, gross abnormality on inspection.  Palpation to medial and lateral knee joint insight mild pain.  There is pain along the joint line.  Patella is not ballotable bilaterally.  ASSESSMENT/PLAN:   1. Bilateral knee pain, chronic See procedure note below. RTC as needed   Procedures: Right knee injected at pes bursa. Injection sites sterilely prepped with alcohol swab. 5 cc lidocaine without epi and 40 mg methylprednisolone injected into pes bursa with 22-gauge needle from medial mid-patellar approach. Patient tolerated procedure well.  Left knee injected at pes bursa. Injection sites sterilely prepped with alcohol swab. 5 cc lidocaine without epi and 40 mg methylprednisolone injected into pes bursa with 22-gauge needle from medial mid-patellar approach. Patient tolerated procedure well.   Melene Plan, MD

## 2020-02-13 NOTE — Progress Notes (Signed)
I saw and examined the patient with Dr. Selena Batten and agree with assessment and plan as outlined.    Bilateral knee OA flare-up.  Both injected with 3 cc 1% lidocaine and 40 mg methylprednisolone from medial midpatellar approach.  Good immediate relief.
